# Patient Record
Sex: Female | Born: 1950
Health system: Southern US, Community
[De-identification: ages and names within clinical notes are randomized; demographics above are authoritative.]

## PROBLEM LIST (undated history)

## (undated) DIAGNOSIS — I878 Other specified disorders of veins: Secondary | ICD-10-CM

## (undated) DIAGNOSIS — I82409 Acute embolism and thrombosis of unspecified deep veins of unspecified lower extremity: Secondary | ICD-10-CM

## (undated) DIAGNOSIS — T4145XA Adverse effect of unspecified anesthetic, initial encounter: Secondary | ICD-10-CM

## (undated) DIAGNOSIS — J984 Other disorders of lung: Secondary | ICD-10-CM

## (undated) DIAGNOSIS — E785 Hyperlipidemia, unspecified: Secondary | ICD-10-CM

## (undated) DIAGNOSIS — T8859XA Other complications of anesthesia, initial encounter: Secondary | ICD-10-CM

## (undated) DIAGNOSIS — M1990 Unspecified osteoarthritis, unspecified site: Secondary | ICD-10-CM

## (undated) DIAGNOSIS — M797 Fibromyalgia: Secondary | ICD-10-CM

## (undated) HISTORY — DX: Fibromyalgia: M79.7

## (undated) HISTORY — DX: Other specified disorders of veins: I87.8

## (undated) HISTORY — DX: Hyperlipidemia, unspecified: E78.5

## (undated) HISTORY — DX: Other disorders of lung: J98.4

## (undated) HISTORY — DX: Unspecified osteoarthritis, unspecified site: M19.90

## (undated) HISTORY — DX: Acute embolism and thrombosis of unspecified deep veins of unspecified lower extremity: I82.409

---
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
## 1978-10-19 HISTORY — PX: PARTIAL HYSTERECTOMY: SHX80

## 1980-10-19 HISTORY — PX: OTHER SURGICAL HISTORY: SHX169

## 1986-10-19 HISTORY — PX: SINUS EXPLORATION: SHX5214

## 1993-10-19 HISTORY — PX: KNEE ARTHROSCOPY: SUR90

## 2001-10-19 HISTORY — PX: OTHER SURGICAL HISTORY: SHX169

## 2002-10-19 HISTORY — PX: OTHER SURGICAL HISTORY: SHX169

## 2003-10-20 HISTORY — PX: OTHER SURGICAL HISTORY: SHX169

## 2004-10-19 HISTORY — PX: OTHER SURGICAL HISTORY: SHX169

## 2005-10-19 HISTORY — PX: TOTAL KNEE ARTHROPLASTY: SHX125

## 2006-10-19 HISTORY — PX: OTHER SURGICAL HISTORY: SHX169

## 2007-05-06 HISTORY — PX: OTHER SURGICAL HISTORY: SHX169

## 2008-10-19 HISTORY — PX: TOTAL KNEE ARTHROPLASTY: SHX125

## 2010-10-19 HISTORY — PX: OTHER SURGICAL HISTORY: SHX169

## 2012-10-19 HISTORY — PX: ESOPHAGOGASTRODUODENOSCOPY: SHX1529

## 2012-10-19 HISTORY — PX: COLONOSCOPY: SHX174

## 2015-01-04 HISTORY — PX: OTHER SURGICAL HISTORY: SHX169

## 2015-01-07 HISTORY — PX: OTHER SURGICAL HISTORY: SHX169

## 2018-01-31 ENCOUNTER — Encounter: Payer: Self-pay | Admitting: Gastroenterology

## 2018-03-19 HISTORY — PX: CARDIAC CATHETERIZATION: SHX172

## 2018-04-29 ENCOUNTER — Ambulatory Visit: Payer: Self-pay | Admitting: Gastroenterology

## 2018-05-09 ENCOUNTER — Ambulatory Visit (INDEPENDENT_AMBULATORY_CARE_PROVIDER_SITE_OTHER): Payer: Medicare Other | Admitting: Gastroenterology

## 2018-05-09 ENCOUNTER — Other Ambulatory Visit: Payer: Self-pay

## 2018-05-09 ENCOUNTER — Telehealth: Payer: Self-pay

## 2018-05-09 ENCOUNTER — Encounter: Payer: Self-pay | Admitting: Gastroenterology

## 2018-05-09 DIAGNOSIS — K219 Gastro-esophageal reflux disease without esophagitis: Secondary | ICD-10-CM

## 2018-05-09 DIAGNOSIS — R1319 Other dysphagia: Secondary | ICD-10-CM | POA: Insufficient documentation

## 2018-05-09 DIAGNOSIS — R1310 Dysphagia, unspecified: Secondary | ICD-10-CM | POA: Diagnosis not present

## 2018-05-09 NOTE — Progress Notes (Signed)
Please send copy of Office note to Dr. Carmon Ginsberg"Neil.  NIC for TCS with RMR in 01/2023.

## 2018-05-09 NOTE — Assessment & Plan Note (Signed)
67 year old female presenting for further evaluation of GERD, dysphagia to solid foods.  She reports ENT evaluation with evidence of significant acid reflux in the larynx area.  Also states she had a yeast infection which was treated.  Dysphagia has not improved with PPI therapy/H2 blocker.  Reflux symptoms have somewhat improved.  She still has some nocturnal symptoms depending on position in the bed.  Heartburn with certain foods.  Offered her EGD with esophageal dilation with Dr. Jena Gaussourk in the near future.  Plan for deep sedation as she has had in the past.  I have discussed the risks, alternatives, benefits with regards to but not limited to the risk of reaction to medication, bleeding, infection, perforation and the patient is agreeable to proceed. Written consent to be obtained.  Patient reports that she has been cleared by cardiology to have endoscopy.  Continue pantoprazole 40 mg but suggest she take 30 minutes before breakfast daily.  Continue Zantac at bedtime.  Reinforced antireflux measures.

## 2018-05-09 NOTE — Telephone Encounter (Signed)
Called and informed pt of pre-op appt 06/22/18 at 12:45pm. Letter mailed. 

## 2018-05-09 NOTE — Patient Instructions (Signed)
1. Upper endoscopy as scheduled. See separate instructions.  2. Try taking your pantoprazole on an empty stomach 30 minutes before a meal. It works better this way!

## 2018-05-09 NOTE — Progress Notes (Signed)
Primary Care Physician:  Corbin Adeourk, Robert M, MD Referring Provider: Wandra Mannanhomas O'Neill, MD Primary Gastroenterologist:  Roetta SessionsMichael Rourk, MD   Chief Complaint  Patient presents with  . Dysphagia    happens with many different foods/liquids    HPI:  Traci Thompson is a 67 y.o. female here for further evaluation of dysphagia at the request of Dr. Wandra Mannanhomas O'Neill. Patient requesting to see Dr. Jena Gaussourk.   Patient previously saw Dr. Elder CyphersShiflett in 2014 [facility]   She presents with complaints of chronic GERD, and difficulty swallowing.  Symptoms been going on for a long time.  She had dysphagia back in 2014 at time of EGD with dilation.  Esophagus appeared normal.  She cannot recall whether or not empiric dilation helped.  She states she has been to ENT who diagnosed her with LPR based on her description.  Possible yeast infection as well.  Chronically she now is on Protonix and Zantac.  Prior to that she was just taking over-the-counter antacids and Zantac.  Really no improvement in her swallowing however.  Typical reflux is well controlled.  If she eats spicy food she gets belching and heartburn.  At nighttime when she lays down if she lays on her right side then she has regurgitation.  With regards to swallowing she can take 5 or 6 pills at a time without any problems.  Liquids go down easily as well.  Solid foods feel like they are getting stuck in the esophagus off and on.  It does not happen every time.  No vomiting.  Denies abdominal pain.  Bowel function is okay.  Denies any melena or rectal bleeding.  Has had recent cardiac evaluation for dyspnea on exertion.  She reports cardiac work-up is complete and she has a 6862-month follow-up at this point.  [facility]. Cardiac catheterization on April 14, 2018 showed nonobstructive coronary artery disease.  Echocardiogram on April 06, 2018 showed LVEF of 60 to 65%, normal left ventricular  wall thickness and normal left ventricular size.  Focal thickening of the aortic valve with mild aortic stenosis.   Current Outpatient Medications  Medication Sig Dispense Refill  . acetaminophen (TYLENOL) 650 MG CR tablet Take 650 mg by mouth as needed for pain.    . Ascorbic Acid (VITAMIN C) 1000 MG tablet Take 1,000 mg by mouth daily.    Marland Kitchen. CALCIUM LACTATE PO Take by mouth. 2 tablets in the AM and 1 in the PM    . Cholecalciferol (VITAMIN D3) 5000 units TABS Take by mouth. 2 -1.0ML once a day    . Flaxseed, Linseed, (FLAX SEEDS PO) Take by mouth. 2 tablespoons once a day    . furosemide (LASIX) 40 MG tablet Take 40 mg by mouth daily as needed.    . Garlic 1000 MG CAPS Take 1 capsule by mouth daily.    Marland Kitchen. GOLDEN SEAL EXTRACT PO Take 1 tablet by mouth daily.    Marland Kitchen. guaiFENesin (MUCINEX) 600 MG 12 hr tablet Take by mouth as needed.    . IODINE, KELP, PO Take 1 tablet by mouth daily.    . magnesium gluconate (MAGONATE) 500 MG tablet Take 1,000 mg by mouth 2 (two) times daily.    . meclizine (ANTIVERT) 25 MG tablet Take 25 mg by mouth as needed for dizziness.    . Misc Natural Products (HDL RX PO) Take 1 tablet by mouth 2 (two) times daily.    .Marland Kitchen  Misc Natural Products (LUTEIN 20 PO) Take 1 tablet by mouth daily.    . Multiple Vitamins-Minerals (AIRBORNE PO) Take by mouth as needed.    . Multiple Vitamins-Minerals (MULTIVITAL) tablet Take 3 tablets by mouth daily.    . Omega-3 Fatty Acids (OMEGA 3 PO) Take 1 tablet by mouth 2 (two) times daily.    . pantoprazole (PROTONIX) 40 MG tablet Take 40 mg by mouth daily.    . Probiotic Product (PROBIOTIC PO) Take 1 capsule by mouth daily.    . ranitidine (ZANTAC) 150 MG tablet Take 150 mg by mouth at bedtime.     No current facility-administered medications for this visit.     Allergies as of 05/09/2018 - Review Complete 05/09/2018  Allergen Reaction Noted  . Adhesive [tape]  05/09/2018  . Benzalkonium Itching and Rash 05/09/2018  . Cefdinir Other  (See Comments) 05/09/2018  . Cephalexin Nausea Only 05/09/2018  . Codeine Nausea Only 05/09/2018  . Latex  05/09/2018  . Methylprednisolone Nausea Only 05/09/2018  . Morphine Nausea Only 05/09/2018  . Rifampin Nausea Only 05/09/2018  . Warfarin sodium Nausea Only 05/09/2018  . Propoxyphene  05/09/2018    Past Medical History:  Diagnosis Date  . Chronic venous stasis   . DVT (deep venous thrombosis) (HCC)   . Fibromyalgia   . Hyperlipidemia   . Hypertension   . Osteoarthritis   . Restrictive lung disease    Dr. Lorenza Evangelist    Past Surgical History:  Procedure Laterality Date  . APPENDECTOMY  [address] [place]  . Black widow spider bite  2003   [place]  . Bone spur and suture abscess excision Right 2006   Right foot: [place]  . Bone spur excision  2004   Right ankle, [place]  . CARDIAC CATHETERIZATION  03/2018   [facility]: nonobstructive CAD  . COLONOSCOPY  2014   Dr. Elder Cyphers: Propofol: Left sided diverticulosis.  10-year follow-up colonoscopy recommended  . ESOPHAGOGASTRODUODENOSCOPY  2014   Dr. Elder Cyphers: Propofol.  Small hiatal hernia.  No obvious stricture or esophageal ring.  Empirically dilated with 54 bougie dilator.  Duodenal biopsy with mild focal villous blunting but no evidence of celiac. neg for H.pylori as well.  Esophageal biopsy benign.  . Foot spur Left 2008   [place]  . Great saphenous laser ablation  2005   [place]  . great saphenous laser ablation Right 2012   [facility]  . KNEE ARTHROSCOPY Right 1995   [place], [place]  . Open stasis ulcer  1982   [place], [place]  . PARTIAL HYSTERECTOMY  1980   Uterus only, [place], [place]  . SINUS EXPLORATION  1988   [place], [place]  . Skin grafts  2004   Brown recluse spider bite-lateral right ankle, open stasis ulcer-medial right ankle [facility] [place])  . TOTAL KNEE ARTHROPLASTY Right 2007   [place]  . TOTAL KNEE ARTHROPLASTY Left 2010    [place]    Family History  Problem Relation Age of Onset  . Urinary tract infection Mother 6       died from UTI/infection  . Leukemia Father 2  . Colon cancer Neg Hx     Social History   Socioeconomic History  . Marital status: Married    Spouse name: Not on file  . Number of children: Not on file  . Years of education: Not on file  . Highest education level: Not on file  Occupational History  .  Occupation: retired Scientist, research (physical sciences)  Social Needs  . Financial resource strain: Not on file  . Food insecurity:    Worry: Not on file    Inability: Not on file  . Transportation needs:    Medical: Not on file    Non-medical: Not on file  Tobacco Use  . Smoking status: Never Smoker  . Smokeless tobacco: Never Used  Substance and Sexual Activity  . Alcohol use: Never    Frequency: Never  . Drug use: Never  . Sexual activity: Not on file  Lifestyle  . Physical activity:    Days per week: Not on file    Minutes per session: Not on file  . Stress: Not on file  Relationships  . Social connections:    Talks on phone: Not on file    Gets together: Not on file    Attends religious service: Not on file    Active member of club or organization: Not on file    Attends meetings of clubs or organizations: Not on file    Relationship status: Not on file  . Intimate partner violence:    Fear of current or ex partner: Not on file    Emotionally abused: Not on file    Physically abused: Not on file    Forced sexual activity: Not on file  Other Topics Concern  . Not on file  Social History Narrative  . Not on file      ROS:  General: Negative for anorexia, weight loss, fever, chills, fatigue, weakness. Eyes: Negative for vision changes.  ENT: Negative for hoarseness, difficulty swallowing , nasal congestion. CV: Negative for chest pain, angina, palpitations, positive dyspnea on exertion, positive peripheral edema.  Respiratory: Negative for dyspnea at rest,  dyspnea on exertion, cough, sputum, wheezing.  GI: See history of present illness. GU:  Negative for dysuria, hematuria, urinary incontinence, urinary frequency, nocturnal urination.  MS: Chronic pain, DDD, osteoarthritis, fibromyalgia.  Manages with supplements. Derm: Negative for rash or itching.  Neuro: Negative for weakness, abnormal sensation, seizure, frequent headaches, memory loss, confusion.  Psych: Negative for anxiety, depression, suicidal ideation, hallucinations.  Endo: Negative for unusual weight change.  Heme: Negative for bruising or bleeding. Allergy: Negative for rash or hives.    Physical Examination:  BP 134/79   Pulse 76   Temp (!) 97.3 F (36.3 C) (Oral)   Ht 5\' 5"  (1.651 m)   Wt 250 lb 12.8 oz (113.8 kg)   BMI 41.74 kg/m    General: Well-nourished, well-developed in no acute distress.  Very pleasant.  Accompanied by spouse. Head: Normocephalic, atraumatic.   Eyes: Conjunctiva pink, no icterus. Mouth: Oropharyngeal mucosa moist and pink , no lesions erythema or exudate. Neck: Supple without thyromegaly, masses, or lymphadenopathy.  Lungs: Clear to auscultation bilaterally.  Heart: Regular rate and rhythm, no murmurs rubs or gallops.  Abdomen: Bowel sounds are normal, nontender, nondistended, no hepatosplenomegaly or masses, no abdominal bruits or    hernia , no rebound or guarding.   Rectal: Not performed. Extremities: No lower extremity edema. No clubbing or deformities.  Neuro: Alert and oriented x 4 , grossly normal neurologically.  Skin: Warm and dry, no rash or jaundice.   Psych: Alert and cooperative, normal mood and affect.  Labs: Labs from April 06, 2018 at [facility].  White blood cell count 6900, hemoglobin 13.4, hematocrit 39.5, platelets 224,000.  BUN 14, creatinine 0.59, glucose 101, total bilirubin 0.5, alkaline phosphatase 63, AST 17, ALT  16, albumin 4.1.  Imaging Studies: No results found.

## 2018-05-09 NOTE — Progress Notes (Signed)
CC'D TO PCP °

## 2018-06-06 ENCOUNTER — Telehealth: Payer: Self-pay | Admitting: *Deleted

## 2018-06-06 NOTE — Telephone Encounter (Signed)
Patient called and had to r/s her EGD w/ MAC on 06/27/18. She has rescheduled to 07/18/18 at 11:15am. New pre-op appt is scheduled for 07/11/18 at 11:00am. I have mailed new instructions and pre-op appt to her. Called patient and LMOVM.

## 2018-06-22 ENCOUNTER — Other Ambulatory Visit (HOSPITAL_COMMUNITY): Payer: Medicare Other

## 2018-07-07 NOTE — Patient Instructions (Signed)
Traci Thompson  07/07/2018     @PREFPERIOPPHARMACY @   Your procedure is scheduled on  07/18/2018   Report to [facility] at  945   A.M.  Call this number if you have problems the morning of surgery:  [phone]   Remember:  Do not eat or drink after midnight.  You may drink clear liquids until  ( follow the instructions given to you) .  Clear liquids allowed are:                    Water, Juice (non-citric and without pulp), Carbonated beverages, Clear Tea, Black Coffee only, Plain Jell-O only, Gatorade and Plain Popsicles only    Take these medicines the morning of surgery with A SIP OF WATER  Antivert, prozac.    Do not wear jewelry, make-up or nail polish.  Do not wear lotions, powders, or perfumes, or deodorant.  Do not shave 48 hours prior to surgery.  Men may shave face and neck.  Do not bring valuables to the hospital.  [facility] is not responsible for any belongings or valuables.  Contacts, dentures or bridgework may not be worn into surgery.  Leave your suitcase in the car.  After surgery it may be brought to your room.  For patients admitted to the hospital, discharge time will be determined by your treatment team.  Patients discharged the day of surgery will not be allowed to drive home.   Name and phone number of your driver:   family Special instructions:  Follow the diet and prep instructions given to you by Dr Luvenia Starch office.  Please read over the following fact sheets that you were given. Anesthesia Post-op Instructions and Care and Recovery After Surgery       Esophagogastroduodenoscopy Esophagogastroduodenoscopy (EGD) is a procedure to examine the lining of the esophagus, stomach, and first part of the small intestine (duodenum). This procedure is done to check for problems such as inflammation, bleeding, ulcers, or growths. During this procedure, a long, flexible, lighted tube with a camera attached (endoscope) is inserted down the  throat. Tell a health care provider about:  Any allergies you have.  All medicines you are taking, including vitamins, herbs, eye drops, creams, and over-the-counter medicines.  Any problems you or family members have had with anesthetic medicines.  Any blood disorders you have.  Any surgeries you have had.  Any medical conditions you have.  Whether you are pregnant or may be pregnant. What are the risks? Generally, this is a safe procedure. However, problems may occur, including:  Infection.  Bleeding.  A tear (perforation) in the esophagus, stomach, or duodenum.  Trouble breathing.  Excessive sweating.  Spasms of the larynx.  A slowed heartbeat.  Low blood pressure.  What happens before the procedure?  Follow instructions from your health care provider about eating or drinking restrictions.  Ask your health care provider about: ? Changing or stopping your regular medicines. This is especially important if you are taking diabetes medicines or blood thinners. ? Taking medicines such as aspirin and ibuprofen. These medicines can thin your blood. Do not take these medicines before your procedure if your health care provider instructs you not to.  Plan to have someone take you home after the procedure.  If you wear dentures, be ready to remove them before the procedure. What happens during the procedure?  To reduce your risk of infection, your health care team  will wash or sanitize their hands.  An IV tube will be put in a vein in your hand or arm. You will get medicines and fluids through this tube.  You will be given one or more of the following: ? A medicine to help you relax (sedative). ? A medicine to numb the area (local anesthetic). This medicine may be sprayed into your throat. It will make you feel more comfortable and keep you from gagging or coughing during the procedure. ? A medicine for pain.  A mouth guard may be placed in your mouth to protect your  teeth and to keep you from biting on the endoscope.  You will be asked to lie on your left side.  The endoscope will be lowered down your throat into your esophagus, stomach, and duodenum.  Air will be put into the endoscope. This will help your health care provider see better.  The lining of your esophagus, stomach, and duodenum will be examined.  Your health care provider may: ? Take a tissue sample so it can be looked at in a lab (biopsy). ? Remove growths. ? Remove objects (foreign bodies) that are stuck. ? Treat any bleeding with medicines or other devices that stop tissue from bleeding. ? Widen (dilate) or stretch narrowed areas of your esophagus and stomach.  The endoscope will be taken out. The procedure may vary among health care providers and hospitals. What happens after the procedure?  Your blood pressure, heart rate, breathing rate, and blood oxygen level will be monitored often until the medicines you were given have worn off.  Do not eat or drink anything until the numbing medicine has worn off and your gag reflex has returned. This information is not intended to replace advice given to you by your health care provider. Make sure you discuss any questions you have with your health care provider. Document Released: 02/05/2005 Document Revised: 03/12/2016 Document Reviewed: 08/29/2015 Elsevier Interactive Patient Education  2018 [employer]. Esophagogastroduodenoscopy, Care After Refer to this sheet in the next few weeks. These instructions provide you with information about caring for yourself after your procedure. Your health care provider may also give you more specific instructions. Your treatment has been planned according to current medical practices, but problems sometimes occur. Call your health care provider if you have any problems or questions after your procedure. What can I expect after the procedure? After the procedure, it is common to have:  A sore  throat.  Nausea.  Bloating.  Dizziness.  Fatigue.  Follow these instructions at home:  Do not eat or drink anything until the numbing medicine (local anesthetic) has worn off and your gag reflex has returned. You will know that the local anesthetic has worn off when you can swallow comfortably.  Do not drive for 24 hours if you received a medicine to help you relax (sedative).  If your health care provider took a tissue sample for testing during the procedure, make sure to get your test results. This is your responsibility. Ask your health care provider or the department performing the test when your results will be ready.  Keep all follow-up visits as told by your health care provider. This is important. Contact a health care provider if:  You cannot stop coughing.  You are not urinating.  You are urinating less than usual. Get help right away if:  You have trouble swallowing.  You cannot eat or drink.  You have throat or chest pain that gets worse.  You  are dizzy or light-headed.  You faint.  You have nausea or vomiting.  You have chills.  You have a fever.  You have severe abdominal pain.  You have black, tarry, or bloody stools. This information is not intended to replace advice given to you by your health care provider. Make sure you discuss any questions you have with your health care provider. Document Released: 09/21/2012 Document Revised: 03/12/2016 Document Reviewed: 08/29/2015 Elsevier Interactive Patient Education  2018 [employer].  Esophageal Dilatation Esophageal dilatation is a procedure to open a blocked or narrowed part of the esophagus. The esophagus is the long tube in your throat that carries food and liquid from your mouth to your stomach. The procedure is also called esophageal dilation. You may need this procedure if you have a buildup of scar tissue in your esophagus that makes it difficult, painful, or even impossible to swallow. This  can be caused by gastroesophageal reflux disease (GERD). In rare cases, people need this procedure because they have cancer of the esophagus or a problem with the way food moves through the esophagus. Sometimes you may need to have another dilatation to enlarge the opening of the esophagus gradually. Tell a health care provider about:  Any allergies you have.  All medicines you are taking, including vitamins, herbs, eye drops, creams, and over-the-counter medicines.  Any problems you or family members have had with anesthetic medicines.  Any blood disorders you have.  Any surgeries you have had.  Any medical conditions you have.  Any antibiotic medicines you are required to take before dental procedures. What are the risks? Generally, this is a safe procedure. However, problems can occur and include:  Bleeding from a tear in the lining of the esophagus.  A hole (perforation) in the esophagus.  What happens before the procedure?  Do not eat or drink anything after midnight on the night before the procedure or as directed by your health care provider.  Ask your health care provider about changing or stopping your regular medicines. This is especially important if you are taking diabetes medicines or blood thinners.  Plan to have someone take you home after the procedure. What happens during the procedure?  You will be given a medicine that makes you relaxed and sleepy (sedative).  A medicine may be sprayed or gargled to numb the back of the throat.  Your health care provider can use various instruments to do an esophageal dilatation. During the procedure, the instrument used will be placed in your mouth and passed down into your esophagus. Options include: ? Simple dilators. This instrument is carefully placed in the esophagus to stretch it. ? Guided wire bougies. In this method, a flexible tube (endoscope) is used to insert a wire into the esophagus. The dilator is passed over  this wire to enlarge the esophagus. Then the wire is removed. ? Balloon dilators. An endoscope with a small balloon at the end is passed down into the esophagus. Inflating the balloon gently stretches the esophagus and opens it up. What happens after the procedure?  Your blood pressure, heart rate, breathing rate, and blood oxygen level will be monitored often until the medicines you were given have worn off.  Your throat may feel slightly sore and will probably still feel numb. This will improve slowly over time.  You will not be allowed to eat or drink until the throat numbness has resolved.  If this is a same-day procedure, you may be allowed to go home once  you have been able to drink, urinate, and sit on the edge of the bed without nausea or dizziness.  If this is a same-day procedure, you should have a friend or family member with you for the next 24 hours after the procedure. This information is not intended to replace advice given to you by your health care provider. Make sure you discuss any questions you have with your health care provider. Document Released: 11/26/2005 Document Revised: 03/12/2016 Document Reviewed: 02/14/2014 Elsevier Interactive Patient Education  2018 [employer]  Monitored Anesthesia Care Anesthesia is a term that refers to techniques, procedures, and medicines that help a person stay safe and comfortable during a medical procedure. Monitored anesthesia care, or sedation, is one type of anesthesia. Your anesthesia specialist may recommend sedation if you will be having a procedure that does not require you to be unconscious, such as:  Cataract surgery.  A dental procedure.  A biopsy.  A colonoscopy.  During the procedure, you may receive a medicine to help you relax (sedative). There are three levels of sedation:  Mild sedation. At this level, you may feel awake and relaxed. You will be able to follow directions.  Moderate sedation. At this level,  you will be sleepy. You may not remember the procedure.  Deep sedation. At this level, you will be asleep. You will not remember the procedure.  The more medicine you are given, the deeper your level of sedation will be. Depending on how you respond to the procedure, the anesthesia specialist may change your level of sedation or the type of anesthesia to fit your needs. An anesthesia specialist will monitor you closely during the procedure. Let your health care provider know about:  Any allergies you have.  All medicines you are taking, including vitamins, herbs, eye drops, creams, and over-the-counter medicines.  Any use of steroids (by mouth or as a cream).  Any problems you or family members have had with sedatives and anesthetic medicines.  Any blood disorders you have.  Any surgeries you have had.  Any medical conditions you have, such as sleep apnea.  Whether you are pregnant or may be pregnant.  Any use of cigarettes, alcohol, or street drugs. What are the risks? Generally, this is a safe procedure. However, problems may occur, including:  Getting too much medicine (oversedation).  Nausea.  Allergic reaction to medicines.  Trouble breathing. If this happens, a breathing tube may be used to help with breathing. It will be removed when you are awake and breathing on your own.  Heart trouble.  Lung trouble.  Before the procedure Staying hydrated Follow instructions from your health care provider about hydration, which may include:  Up to 2 hours before the procedure - you may continue to drink clear liquids, such as water, clear fruit juice, black coffee, and plain tea.  Eating and drinking restrictions Follow instructions from your health care provider about eating and drinking, which may include:  8 hours before the procedure - stop eating heavy meals or foods such as meat, fried foods, or fatty foods.  6 hours before the procedure - stop eating light meals or  foods, such as toast or cereal.  6 hours before the procedure - stop drinking milk or drinks that contain milk.  2 hours before the procedure - stop drinking clear liquids.  Medicines Ask your health care provider about:  Changing or stopping your regular medicines. This is especially important if you are taking diabetes medicines or blood thinners.  Taking medicines such as aspirin and ibuprofen. These medicines can thin your blood. Do not take these medicines before your procedure if your health care provider instructs you not to.  Tests and exams  You will have a physical exam.  You may have blood tests done to show: ? How well your kidneys and liver are working. ? How well your blood can clot.  General instructions  Plan to have someone take you home from the hospital or clinic.  If you will be going home right after the procedure, plan to have someone with you for 24 hours.  What happens during the procedure?  Your blood pressure, heart rate, breathing, level of pain and overall condition will be monitored.  An IV tube will be inserted into one of your veins.  Your anesthesia specialist will give you medicines as needed to keep you comfortable during the procedure. This may mean changing the level of sedation.  The procedure will be performed. After the procedure  Your blood pressure, heart rate, breathing rate, and blood oxygen level will be monitored until the medicines you were given have worn off.  Do not drive for 24 hours if you received a sedative.  You may: ? Feel sleepy, clumsy, or nauseous. ? Feel forgetful about what happened after the procedure. ? Have a sore throat if you had a breathing tube during the procedure. ? Vomit. This information is not intended to replace advice given to you by your health care provider. Make sure you discuss any questions you have with your health care provider. Document Released: 07/01/2005 Document Revised: 03/13/2016  Document Reviewed: 01/26/2016 Elsevier Interactive Patient Education  2018 [employer] Monitored Anesthesia Care, Care After These instructions provide you with information about caring for yourself after your procedure. Your health care provider may also give you more specific instructions. Your treatment has been planned according to current medical practices, but problems sometimes occur. Call your health care provider if you have any problems or questions after your procedure. What can I expect after the procedure? After your procedure, it is common to:  Feel sleepy for several hours.  Feel clumsy and have poor balance for several hours.  Feel forgetful about what happened after the procedure.  Have poor judgment for several hours.  Feel nauseous or vomit.  Have a sore throat if you had a breathing tube during the procedure.  Follow these instructions at home: For at least 24 hours after the procedure:   Do not: ? Participate in activities in which you could fall or become injured. ? Drive. ? Use heavy machinery. ? Drink alcohol. ? Take sleeping pills or medicines that cause drowsiness. ? Make important decisions or sign legal documents. ? Take care of children on your own.  Rest. Eating and drinking  Follow the diet that is recommended by your health care provider.  If you vomit, drink water, juice, or soup when you can drink without vomiting.  Make sure you have little or no nausea before eating solid foods. General instructions  Have a responsible adult stay with you until you are awake and alert.  Take over-the-counter and prescription medicines only as told by your health care provider.  If you smoke, do not smoke without supervision.  Keep all follow-up visits as told by your health care provider. This is important. Contact a health care provider if:  You keep feeling nauseous or you keep vomiting.  You feel light-headed.  You develop a rash.  You  have a fever. Get help right away if:  You have trouble breathing. This information is not intended to replace advice given to you by your health care provider. Make sure you discuss any questions you have with your health care provider. Document Released: 01/26/2016 Document Revised: 05/27/2016 Document Reviewed: 01/26/2016 Elsevier Interactive Patient Education  [employer].

## 2018-07-11 ENCOUNTER — Encounter (HOSPITAL_COMMUNITY)
Admission: RE | Admit: 2018-07-11 | Discharge: 2018-07-11 | Disposition: A | Discharge status: Home / Self Care | Payer: Medicare Other | Source: Ambulatory Visit | Attending: Internal Medicine | Admitting: Internal Medicine

## 2018-07-11 ENCOUNTER — Other Ambulatory Visit: Payer: Self-pay

## 2018-07-11 ENCOUNTER — Encounter (HOSPITAL_COMMUNITY): Payer: Self-pay

## 2018-07-11 DIAGNOSIS — Z01812 Encounter for preprocedural laboratory examination: Secondary | ICD-10-CM | POA: Diagnosis present

## 2018-07-11 DIAGNOSIS — R9431 Abnormal electrocardiogram [ECG] [EKG]: Secondary | ICD-10-CM | POA: Insufficient documentation

## 2018-07-11 DIAGNOSIS — Z01810 Encounter for preprocedural cardiovascular examination: Secondary | ICD-10-CM | POA: Insufficient documentation

## 2018-07-11 DIAGNOSIS — I4510 Unspecified right bundle-branch block: Secondary | ICD-10-CM | POA: Diagnosis not present

## 2018-07-11 HISTORY — DX: Adverse effect of unspecified anesthetic, initial encounter: T41.45XA

## 2018-07-11 HISTORY — DX: Other complications of anesthesia, initial encounter: T88.59XA

## 2018-07-11 LAB — CBC WITH DIFFERENTIAL/PLATELET
Basophils Absolute: 0 10*3/uL (ref 0.0–0.1)
Basophils Relative: 0 %
EOS PCT: 2 %
Eosinophils Absolute: 0.1 10*3/uL (ref 0.0–0.7)
HEMATOCRIT: 40.7 % (ref 36.0–46.0)
Hemoglobin: 13.5 g/dL (ref 12.0–15.0)
LYMPHS PCT: 37 %
Lymphs Abs: 2.5 10*3/uL (ref 0.7–4.0)
MCH: 29 pg (ref 26.0–34.0)
MCHC: 33.2 g/dL (ref 30.0–36.0)
MCV: 87.5 fL (ref 78.0–100.0)
MONO ABS: 0.4 10*3/uL (ref 0.1–1.0)
Monocytes Relative: 6 %
NEUTROS ABS: 3.6 10*3/uL (ref 1.7–7.7)
Neutrophils Relative %: 55 %
PLATELETS: 245 10*3/uL (ref 150–400)
RBC: 4.65 MIL/uL (ref 3.87–5.11)
RDW: 13.6 % (ref 11.5–15.5)
WBC: 6.7 10*3/uL (ref 4.0–10.5)

## 2018-07-11 LAB — BASIC METABOLIC PANEL
ANION GAP: 7 (ref 5–15)
BUN: 16 mg/dL (ref 8–23)
CALCIUM: 9.3 mg/dL (ref 8.9–10.3)
CO2: 28 mmol/L (ref 22–32)
Chloride: 103 mmol/L (ref 98–111)
Creatinine, Ser: 0.58 mg/dL (ref 0.44–1.00)
GLUCOSE: 99 mg/dL (ref 70–99)
Potassium: 4.1 mmol/L (ref 3.5–5.1)
Sodium: 138 mmol/L (ref 135–145)

## 2018-07-18 ENCOUNTER — Ambulatory Visit (HOSPITAL_COMMUNITY)
Admission: RE | Admit: 2018-07-18 | Discharge: 2018-07-18 | Disposition: A | Discharge status: Home / Self Care | Payer: Medicare Other | Source: Ambulatory Visit | Attending: Internal Medicine | Admitting: Internal Medicine

## 2018-07-18 ENCOUNTER — Encounter (HOSPITAL_COMMUNITY): Payer: Self-pay | Admitting: *Deleted

## 2018-07-18 ENCOUNTER — Encounter (HOSPITAL_COMMUNITY)
Admission: RE | Disposition: A | Discharge status: Home / Self Care | Payer: Self-pay | Source: Ambulatory Visit | Attending: Internal Medicine

## 2018-07-18 ENCOUNTER — Ambulatory Visit (HOSPITAL_COMMUNITY): Payer: Medicare Other | Admitting: Anesthesiology

## 2018-07-18 ENCOUNTER — Other Ambulatory Visit: Payer: Self-pay

## 2018-07-18 DIAGNOSIS — Z96653 Presence of artificial knee joint, bilateral: Secondary | ICD-10-CM | POA: Insufficient documentation

## 2018-07-18 DIAGNOSIS — K209 Esophagitis, unspecified: Secondary | ICD-10-CM

## 2018-07-18 DIAGNOSIS — Z86718 Personal history of other venous thrombosis and embolism: Secondary | ICD-10-CM | POA: Diagnosis not present

## 2018-07-18 DIAGNOSIS — Z79899 Other long term (current) drug therapy: Secondary | ICD-10-CM | POA: Insufficient documentation

## 2018-07-18 DIAGNOSIS — K449 Diaphragmatic hernia without obstruction or gangrene: Secondary | ICD-10-CM | POA: Diagnosis not present

## 2018-07-18 DIAGNOSIS — R1310 Dysphagia, unspecified: Secondary | ICD-10-CM | POA: Diagnosis not present

## 2018-07-18 DIAGNOSIS — K210 Gastro-esophageal reflux disease with esophagitis: Secondary | ICD-10-CM | POA: Insufficient documentation

## 2018-07-18 DIAGNOSIS — R1319 Other dysphagia: Secondary | ICD-10-CM

## 2018-07-18 DIAGNOSIS — K219 Gastro-esophageal reflux disease without esophagitis: Secondary | ICD-10-CM

## 2018-07-18 HISTORY — PX: ESOPHAGOGASTRODUODENOSCOPY (EGD) WITH PROPOFOL: SHX5813

## 2018-07-18 HISTORY — PX: MALONEY DILATION: SHX5535

## 2018-07-18 SURGERY — ESOPHAGOGASTRODUODENOSCOPY (EGD) WITH PROPOFOL
Anesthesia: Monitor Anesthesia Care

## 2018-07-18 MED ORDER — HYDROMORPHONE HCL 1 MG/ML IJ SOLN: 0.2500 mg | INTRAMUSCULAR | Status: DC | PRN

## 2018-07-18 MED ORDER — HYDROCODONE-ACETAMINOPHEN 7.5-325 MG PO TABS: 1.0000 | ORAL_TABLET | Freq: Once | ORAL | Status: DC | PRN

## 2018-07-18 MED ORDER — STERILE WATER FOR IRRIGATION IR SOLN
Status: DC | PRN
  Administered 2018-07-18: 100 mL

## 2018-07-18 MED ORDER — PROPOFOL 500 MG/50ML IV EMUL
INTRAVENOUS | Status: DC | PRN
  Administered 2018-07-18: 150 ug/kg/min via INTRAVENOUS

## 2018-07-18 MED ORDER — LACTATED RINGERS IV SOLN
INTRAVENOUS | Status: DC | PRN
  Administered 2018-07-18: 10:00:00 via INTRAVENOUS

## 2018-07-18 MED ORDER — CHLORHEXIDINE GLUCONATE CLOTH 2 % EX PADS: 6.0000 | MEDICATED_PAD | Freq: Once | CUTANEOUS | Status: DC

## 2018-07-18 MED ORDER — LACTATED RINGERS IV SOLN: INTRAVENOUS | Status: DC

## 2018-07-18 MED ORDER — PROPOFOL 10 MG/ML IV BOLUS
INTRAVENOUS | Status: DC | PRN
  Administered 2018-07-18: 20 mg via INTRAVENOUS

## 2018-07-18 MED ORDER — MEPERIDINE HCL 100 MG/ML IJ SOLN: 6.2500 mg | INTRAMUSCULAR | Status: DC | PRN

## 2018-07-18 MED ORDER — PROMETHAZINE HCL 25 MG/ML IJ SOLN: 6.2500 mg | INTRAMUSCULAR | Status: DC | PRN

## 2018-07-18 NOTE — Transfer of Care (Signed)
Immediate Anesthesia Transfer of Care Note  Patient: Traci Thompson  Procedure(s) Performed: ESOPHAGOGASTRODUODENOSCOPY (EGD) WITH PROPOFOL (N/A ) MALONEY DILATION (N/A )  Patient Location: PACU  Anesthesia Type:MAC  Level of Consciousness: awake  Airway & Oxygen Therapy: Patient Spontanous Breathing  Post-op Assessment: Report given to RN  Post vital signs: Reviewed and stable  Last Vitals:  Vitals Value Taken Time  BP 114/52 07/18/2018 11:05 AM  Temp    Pulse 75 07/18/2018 11:07 AM  Resp 16 07/18/2018 11:07 AM  SpO2 97 % 07/18/2018 11:07 AM  Vitals shown include unvalidated device data.  Last Pain:  Vitals:   07/18/18 1101  TempSrc:   PainSc: 0-No pain         Complications: No apparent anesthesia complications

## 2018-07-18 NOTE — Anesthesia Preprocedure Evaluation (Signed)
Anesthesia Evaluation  Patient identified by MRN, date of birth, ID band Patient awake    Reviewed: Allergy & Precautions, H&P , NPO status , Patient's Chart, lab work & pertinent test results, reviewed documented beta blocker date and time   History of Anesthesia Complications (+) PONV, AWARENESS UNDER ANESTHESIA and history of anesthetic complications  Airway Mallampati: II  TM Distance: >3 FB Neck ROM: full    Dental no notable dental hx. (+) Teeth Intact, Dental Advidsory Given   Pulmonary neg pulmonary ROS,    Pulmonary exam normal breath sounds clear to auscultation       Cardiovascular Exercise Tolerance: Good negative cardio ROS   Rhythm:regular Rate:Normal     Neuro/Psych  Neuromuscular disease negative neurological ROS  negative psych ROS   GI/Hepatic negative GI ROS, Neg liver ROS, GERD  ,  Endo/Other  negative endocrine ROS  Renal/GU negative Renal ROS  negative genitourinary   Musculoskeletal   Abdominal   Peds  Hematology negative hematology ROS (+)   Anesthesia Other Findings NSR at 69, incomplete RBBB DVT  Restrictive lung ds per record- pt denies dx "Awareness under anesthesia" SAB with sedation, TKA  Reproductive/Obstetrics negative OB ROS                             Anesthesia Physical Anesthesia Plan  ASA: II  Anesthesia Plan: MAC   Post-op Pain Management:    Induction:   PONV Risk Score and Plan:   Airway Management Planned:   Additional Equipment:   Intra-op Plan:   Post-operative Plan:   Informed Consent: I have reviewed the patients History and Physical, chart, labs and discussed the procedure including the risks, benefits and alternatives for the proposed anesthesia with the patient or authorized representative who has indicated his/her understanding and acceptance.   Dental Advisory Given  Plan Discussed with: CRNA and  Anesthesiologist  Anesthesia Plan Comments:         Anesthesia Quick Evaluation

## 2018-07-18 NOTE — Discharge Instructions (Signed)
EGD Discharge instructions Please read the instructions outlined below and refer to this sheet in the next few weeks. These discharge instructions provide you with general information on caring for yourself after you leave the hospital. Your doctor may also give you specific instructions. While your treatment has been planned according to the most current medical practices available, unavoidable complications occasionally occur. If you have any problems or questions after discharge, please call your doctor. ACTIVITY  You may resume your regular activity but move at a slower pace for the next 24 hours.   Take frequent rest periods for the next 24 hours.   Walking will help expel (get rid of) the air and reduce the bloated feeling in your abdomen.   No driving for 24 hours (because of the anesthesia (medicine) used during the test).   You may shower.   Do not sign any important legal documents or operate any machinery for 24 hours (because of the anesthesia used during the test).  NUTRITION  Drink plenty of fluids.   You may resume your normal diet.   Begin with a light meal and progress to your normal diet.   Avoid alcoholic beverages for 24 hours or as instructed by your caregiver.  MEDICATIONS  You may resume your normal medications unless your caregiver tells you otherwise.  WHAT YOU CAN EXPECT TODAY  You may experience abdominal discomfort such as a feeling of fullness or gas pains.  FOLLOW-UP  Your doctor will discuss the results of your test with you.  SEEK IMMEDIATE MEDICAL ATTENTION IF ANY OF THE FOLLOWING OCCUR:  Excessive nausea (feeling sick to your stomach) and/or vomiting.   Severe abdominal pain and distention (swelling).   Trouble swallowing.   Temperature over 101 F (37.8 C).   Rectal bleeding or vomiting of blood.    GERD information provided  Hiatal hernia information provided  For now, increase Protonix to 40 mg twice daily  Office visit with  [place] in 3 months     Gastroesophageal Reflux Disease, Adult Normally, food travels down the esophagus and stays in the stomach to be digested. If a person has gastroesophageal reflux disease (GERD), food and stomach acid move back up into the esophagus. When this happens, the esophagus becomes sore and swollen (inflamed). Over time, GERD can make small holes (ulcers) in the lining of the esophagus. Follow these instructions at home: Diet  Follow a diet as told by your doctor. You may need to avoid foods and drinks such as: ? Coffee and tea (with or without caffeine). ? Drinks that contain alcohol. ? Energy drinks and sports drinks. ? Carbonated drinks or sodas. ? Chocolate and cocoa. ? Peppermint and mint flavorings. ? Garlic and onions. ? Horseradish. ? Spicy and acidic foods, such as peppers, chili powder, curry powder, vinegar, hot sauces, and BBQ sauce. ? Citrus fruit juices and citrus fruits, such as oranges, lemons, and limes. ? Tomato-based foods, such as red sauce, chili, salsa, and pizza with red sauce. ? Fried and fatty foods, such as donuts, french fries, potato chips, and high-fat dressings. ? High-fat meats, such as hot dogs, rib eye steak, sausage, ham, and bacon. ? High-fat dairy items, such as whole milk, butter, and cream cheese.  Eat small meals often. Avoid eating large meals.  Avoid drinking large amounts of liquid with your meals.  Avoid eating meals during the 2-3 hours before bedtime.  Avoid lying down right after you eat.  Do not exercise right after you eat. General  instructions  Pay attention to any changes in your symptoms.  Take over-the-counter and prescription medicines only as told by your doctor. Do not take aspirin, ibuprofen, or other NSAIDs unless your doctor says it is okay.  Do not use any tobacco products, including cigarettes, chewing tobacco, and e-cigarettes. If you need help quitting, ask your doctor.  Wear loose clothes. Do not wear  anything tight around your waist.  Raise (elevate) the head of your bed about 6 inches (15 cm).  Try to lower your stress. If you need help doing this, ask your doctor.  If you are overweight, lose an amount of weight that is healthy for you. Ask your doctor about a safe weight loss goal.  Keep all follow-up visits as told by your doctor. This is important. Contact a doctor if:  You have new symptoms.  You lose weight and you do not know why it is happening.  You have trouble swallowing, or it hurts to swallow.  You have wheezing or a cough that keeps happening.  Your symptoms do not get better with treatment.  You have a hoarse voice. Get help right away if:  You have pain in your arms, neck, jaw, teeth, or back.  You feel sweaty, dizzy, or light-headed.  You have chest pain or shortness of breath.  You throw up (vomit) and your throw up looks like blood or coffee grounds.  You pass out (faint).  Your poop (stool) is bloody or black.  You cannot swallow, drink, or eat. This information is not intended to replace advice given to you by your health care provider. Make sure you discuss any questions you have with your health care provider. Document Released: 03/23/2008 Document Revised: 03/12/2016 Document Reviewed: 01/30/2015 Elsevier Interactive Patient Education  2018 [employer]     Hiatal Hernia A hiatal hernia occurs when part of the stomach slides above the muscle that separates the abdomen from the chest (diaphragm). A person can be born with a hiatal hernia (congenital), or it may develop over time. In almost all cases of hiatal hernia, only the top part of the stomach pushes through the diaphragm. Many people have a hiatal hernia with no symptoms. The larger the hernia, the more likely it is that you will have symptoms. In some cases, a hiatal hernia allows stomach acid to flow back into the tube that carries food from your mouth to your stomach (esophagus).  This may cause heartburn symptoms. Severe heartburn symptoms may mean that you have developed a condition called gastroesophageal reflux disease (GERD). What are the causes? This condition is caused by a weakness in the opening (hiatus) where the esophagus passes through the diaphragm to attach to the upper part of the stomach. A person may be born with a weakness in the hiatus, or a weakness can develop over time. What increases the risk? This condition is more likely to develop in:  Older people. Age is a major risk factor for a hiatal hernia, especially if you are over the age of 27.  Pregnant women.  People who are overweight.  People who have frequent constipation.  What are the signs or symptoms? Symptoms of this condition usually develop in the form of GERD symptoms. Symptoms include:  Heartburn.  Belching.  Indigestion.  Trouble swallowing.  Coughing or wheezing.  Sore throat.  Hoarseness.  Chest pain.  Nausea and vomiting.  How is this diagnosed? This condition may be diagnosed during testing for GERD. Tests that may be done  include:  X-rays of your stomach or chest.  An upper gastrointestinal (GI) series. This is an X-ray exam of your GI tract that is taken after you swallow a chalky liquid that shows up clearly on the X-ray.  Endoscopy. This is a procedure to look into your stomach using a thin, flexible tube that has a tiny camera and light on the end of it.  How is this treated? This condition may be treated by:  Dietary and lifestyle changes to help reduce GERD symptoms.  Medicines. These may include: ? Over-the-counter antacids. ? Medicines that make your stomach empty more quickly. ? Medicines that block the production of stomach acid (H2 blockers). ? Stronger medicines to reduce stomach acid (proton pump inhibitors).  Surgery to repair the hernia, if other treatments are not helping.  If you have no symptoms, you may not need  treatment. Follow these instructions at home: Lifestyle and activity  Do not use any products that contain nicotine or tobacco, such as cigarettes and e-cigarettes. If you need help quitting, ask your health care provider.  Try to achieve and maintain a healthy body weight.  Avoid putting pressure on your abdomen. Anything that puts pressure on your abdomen increases the amount of acid that may be pushed up into your esophagus. ? Avoid bending over, especially after eating. ? Raise the head of your bed by putting blocks under the legs. This keeps your head and esophagus higher than your stomach. ? Do not wear tight clothing around your chest or stomach. ? Try not to strain when having a bowel movement, when urinating, or when lifting heavy objects. Eating and drinking  Avoid foods that can worsen GERD symptoms. These may include: ? Fatty foods, like fried foods. ? Citrus fruits, like oranges or lemon. ? Other foods and drinks that contain acid, like orange juice or tomatoes. ? Spicy food. ? Chocolate.  Eat frequent small meals instead of three large meals a day. This helps prevent your stomach from getting too full. ? Eat slowly. ? Do not lie down right after eating. ? Do not eat 1-2 hours before bed.  Do not drink beverages with caffeine. These include cola, coffee, cocoa, and tea.  Do not drink alcohol. General instructions  Take over-the-counter and prescription medicines only as told by your health care provider.  Keep all follow-up visits as told by your health care provider. This is important. Contact a health care provider if:  Your symptoms are not controlled with medicines or lifestyle changes.  You are having trouble swallowing.  You have coughing or wheezing that will not go away. Get help right away if:  Your pain is getting worse.  Your pain spreads to your arms, neck, jaw, teeth, or back.  You have shortness of breath.  You sweat for no reason.  You  feel sick to your stomach (nauseous) or you vomit.  You vomit blood.  You have bright red blood in your stools.  You have black, tarry stools. This information is not intended to replace advice given to you by your health care provider. Make sure you discuss any questions you have with your health care provider. Document Released: 12/26/2003 Document Revised: 09/28/2016 Document Reviewed: 09/28/2016 Elsevier Interactive Patient Education  2018 [employer]     Monitored Anesthesia Care, Care After These instructions provide you with information about caring for yourself after your procedure. Your health care provider may also give you more specific instructions. Your treatment has been planned according to  current medical practices, but problems sometimes occur. Call your health care provider if you have any problems or questions after your procedure. What can I expect after the procedure? After your procedure, it is common to:  Feel sleepy for several hours.  Feel clumsy and have poor balance for several hours.  Feel forgetful about what happened after the procedure.  Have poor judgment for several hours.  Feel nauseous or vomit.  Have a sore throat if you had a breathing tube during the procedure.  Follow these instructions at home: For at least 24 hours after the procedure:   Do not: ? Participate in activities in which you could fall or become injured. ? Drive. ? Use heavy machinery. ? Drink alcohol. ? Take sleeping pills or medicines that cause drowsiness. ? Make important decisions or sign legal documents. ? Take care of children on your own.  Rest. Eating and drinking  Follow the diet that is recommended by your health care provider.  If you vomit, drink water, juice, or soup when you can drink without vomiting.  Make sure you have little or no nausea before eating solid foods. General instructions  Have a responsible adult stay with you until you are  awake and alert.  Take over-the-counter and prescription medicines only as told by your health care provider.  If you smoke, do not smoke without supervision.  Keep all follow-up visits as told by your health care provider. This is important. Contact a health care provider if:  You keep feeling nauseous or you keep vomiting.  You feel light-headed.  You develop a rash.  You have a fever. Get help right away if:  You have trouble breathing. This information is not intended to replace advice given to you by your health care provider. Make sure you discuss any questions you have with your health care provider. Document Released: 01/26/2016 Document Revised: 05/27/2016 Document Reviewed: 01/26/2016 Elsevier Interactive Patient Education  [employer].

## 2018-07-18 NOTE — Op Note (Signed)
[facility] Patient Name: Traci Thompson Procedure Date: 07/18/2018 10:26 AM MRN: [id number] Date of Birth: August 04, 1951 Attending MD: Gennette Pac , MD CSN: [id number] Age: 67 Admit Type: Outpatient Procedure:                Upper GI endoscopy Indications:              Dysphagia Providers:                Gennette Pac, MD, Edrick Kins, RN, Dyann Ruddle Referring MD:             Carlena Bjornstad Hungarland, Ihor Austin. Bufford Buttner, MD Medicines:                Propofol per Anesthesia Complications:            No immediate complications. Estimated Blood Loss:     Estimated blood loss: none. Procedure:                Pre-Anesthesia Assessment:                           - Prior to the procedure, a History and Physical                            was performed, and patient medications and                            allergies were reviewed. The patient's tolerance of                            previous anesthesia was also reviewed. The risks                            and benefits of the procedure and the sedation                            options and risks were discussed with the patient.                            All questions were answered, and informed consent                            was obtained. Prior Anticoagulants: The patient has                            taken no previous anticoagulant or antiplatelet                            agents. ASA Grade Assessment: II - A patient with                            mild systemic disease. After reviewing the risks  and benefits, the patient was deemed in                            satisfactory condition to undergo the procedure.                           After obtaining informed consent, the endoscope was                            passed under direct vision. Throughout the                            procedure, the patient's blood pressure, pulse, and   oxygen saturations were monitored continuously. The                            GIF-H190 ([id number]) scope was introduced through the                            mouth, and advanced to the second part of duodenum.                            The upper GI endoscopy was accomplished without                            difficulty. The patient tolerated the procedure                            well. Scope In: 10:52:06 AM Scope Out: 10:58:56 AM Total Procedure Duration: 0 hours 6 minutes 50 seconds  Findings:      Esophagitis was found. Erosions within 5 mm of the GE junction. Patent       esophagus throughout its course. No tumor. No Barrett's epithelium seen.       The scope was withdrawn. Dilation was performed with a Maloney dilator       with no resistance at 54 Fr. The scope was withdrawn. Dilation was       performed with a Maloney dilator with mild resistance at 56 Fr. The       dilation site was examined following endoscope reinsertion and showed no       change. Estimated blood loss: none.      A small hiatal hernia was present.      The stomach was otherwise without abnormality.      The duodenal bulb and second portion of the duodenum were normal. Impression:               -Mild erosive reflux esophagitis. Status post                            esophageal dilation.                           Small hiatal hernia.                           - The stomach was otherwise normal.                           -  Normal duodenal bulb and second portion of the                            duodenum. Moderate Sedation:      Moderate (conscious) sedation was personally administered by an       anesthesia professional. The following parameters were monitored: oxygen       saturation, heart rate, blood pressure, respiratory rate, EKG, adequacy       of pulmonary ventilation, and response to care. Total physician       intraservice time was 10 minutes. Recommendation:           - Patient has a contact  number available for                            emergencies. The signs and symptoms of potential                            delayed complications were discussed with the                            patient. Return to normal activities tomorrow.                            Written discharge instructions were provided to the                            patient.                           - Advance diet as tolerated. For now, increase                            Protonix to 40 mg twice daily. Office visit with [place]                            in 3 months. Procedure Code(s):        --- Professional ---                           [phone], Esophagogastroduodenoscopy, flexible,                            transoral; with biopsy, single or multiple                           43450, Dilation of esophagus, by unguided sound or                            bougie, single or multiple passes Diagnosis Code(s):        --- Professional ---                           K20.9, Esophagitis, unspecified                           K44.9, Diaphragmatic hernia without  obstruction or                            gangrene                           R13.10, Dysphagia, unspecified CPT copyright 2017 American [employer]. All rights reserved. The codes documented in this report are preliminary and upon coder review may  be revised to meet current compliance requirements. Gerrit Friends. Yina Riviere, MD Gennette Pac, MD 07/18/2018 11:10:39 AM This report has been signed electronically. Number of Addenda: 0

## 2018-07-18 NOTE — Anesthesia Postprocedure Evaluation (Signed)
Anesthesia Post Note  Patient: Traci Thompson  Procedure(s) Performed: ESOPHAGOGASTRODUODENOSCOPY (EGD) WITH PROPOFOL (N/A ) MALONEY DILATION (N/A )  Patient location during evaluation: PACU Anesthesia Type: MAC Level of consciousness: awake and alert and oriented Pain management: pain level controlled Vital Signs Assessment: post-procedure vital signs reviewed and stable Respiratory status: spontaneous breathing Cardiovascular status: blood pressure returned to baseline and stable Postop Assessment: no apparent nausea or vomiting Anesthetic complications: no     Last Vitals:  Vitals:   07/18/18 0957  Pulse: 70  Resp: 16  Temp: 36.6 C  SpO2: 99%    Last Pain:  Vitals:   07/18/18 1101  TempSrc:   PainSc: 0-No pain                 Shane Melby

## 2018-07-18 NOTE — H&P (Signed)
@LOGO @   Primary Care Physician:  Maximiano Coss, MD Primary Gastroenterologist:  Dr. Jena Gauss  Pre-Procedure History & Physical: HPI:  Traci Thompson is a 67 y.o. female here for further evaluation of dysphagia via EGD.  History of EGD with esophageal dilation in [place] previously.  Past Medical History:  Diagnosis Date  . Chronic venous stasis   . Complication of anesthesia    has been difficult to put to sleep in the past  . DVT (deep venous thrombosis) (HCC)   . Fibromyalgia   . Hyperlipidemia   . Osteoarthritis   . Restrictive lung disease    Dr. Lorenza Evangelist    Past Surgical History:  Procedure Laterality Date  . APPENDECTOMY  [address] [place]  . Black widow spider bite  2003   [place]  . Bone spur and suture abscess excision Right 2006   Right foot: [place]  . Bone spur excision  2004   Right ankle, [place]  . CARDIAC CATHETERIZATION  03/2018   [facility]: nonobstructive CAD  . COLONOSCOPY  2014   Dr. Elder Cyphers: Propofol: Left sided diverticulosis.  10-year follow-up colonoscopy recommended  . ESOPHAGOGASTRODUODENOSCOPY  2014   Dr. Elder Cyphers: Propofol.  Small hiatal hernia.  No obvious stricture or esophageal ring.  Empirically dilated with 54 bougie dilator.  Duodenal biopsy with mild focal villous blunting but no evidence of celiac. neg for H.pylori as well.  Esophageal biopsy benign.  . Foot spur Left 2008   [place]  . Great saphenous laser ablation  2005   [place]  . great saphenous laser ablation Right 2012   [facility]  . KNEE ARTHROSCOPY Right 1995   [place], [place]  . Open stasis ulcer  1982   [place], [place]  . PARTIAL HYSTERECTOMY  1980   Uterus only, [employer], [place]  . SINUS EXPLORATION  1988   [place], [place]  . Skin grafts  2004   Brown recluse spider bite-lateral right ankle, open stasis ulcer-medial right ankle [facility] [place]  . TOTAL KNEE ARTHROPLASTY Right 2007   [place]   . TOTAL KNEE ARTHROPLASTY Left 2010   [place]    Prior to Admission medications   Medication Sig Start Date End Date Taking? Authorizing Provider  acetaminophen (TYLENOL) 650 MG CR tablet Take 650 mg by mouth as needed for pain.   Yes [provider]  Ascorbic Acid (VITAMIN C) 1000 MG tablet Take 1,000 mg by mouth daily.   Yes [provider]  CALCIUM LACTATE PO Take 1-2 tablets by mouth See admin instructions. 2 tablets in the AM and 1 in the PM    Yes [provider]  Cholecalciferol (VITAMIN D3) 5000 units TABS Take 10,000 Units by mouth daily. 2 -1. once a day    Yes [provider]  Flaxseed, Linseed, (FLAX SEEDS PO) Take by mouth. 2 tablespoons once a day   Yes [provider]  furosemide (LASIX) 40 MG tablet Take 40 mg by mouth daily as needed for fluid.    Yes [provider]  Garlic 1000 MG CAPS Take 1 capsule by mouth daily.   Yes [provider]  GOLDEN SEAL EXTRACT PO Take 1 tablet by mouth daily.   Yes [provider]  guaiFENesin (MUCINEX) 600 MG 12 hr tablet Take 600 mg by mouth 2 (two) times daily as needed for cough.    Yes [provider]  IODINE, KELP, PO Take 1 tablet by  mouth daily.   Yes [provider]  magnesium gluconate (MAGONATE) 500 MG tablet Take 1,000 mg by mouth 2 (two) times daily.   Yes [provider]  meclizine (ANTIVERT) 25 MG tablet Take 25 mg by mouth 2 (two) times daily as needed for dizziness.    Yes [provider]  Misc Natural Products (HDL RX PO) Take 1 tablet by mouth 2 (two) times daily.   Yes [provider]  Misc Natural Products (LUTEIN 20 PO) Take 1 tablet by mouth daily.   Yes [provider]  Multiple Vitamins-Minerals (AIRBORNE PO) Take by mouth as needed.   Yes [provider]  Multiple Vitamins-Minerals (MULTIVITAL) tablet Take 3 tablets by mouth daily.   Yes [provider]  Omega-3  Fatty Acids (OMEGA 3 PO) Take 1 tablet by mouth 2 (two) times daily.   Yes [provider]  pantoprazole (PROTONIX) 40 MG tablet Take 40 mg by mouth daily.   Yes [provider]  Probiotic Product (PROBIOTIC PO) Take 1 capsule by mouth daily.   Yes [provider]    Allergies as of 05/09/2018 - Review Complete 05/09/2018  Allergen Reaction Noted  . Adhesive [tape]  05/09/2018  . Benzalkonium Itching and Rash 05/09/2018  . Cefdinir Other (See Comments) 05/09/2018  . Cephalexin Nausea Only 05/09/2018  . Codeine Nausea Only 05/09/2018  . Latex  05/09/2018  . Methylprednisolone Nausea Only 05/09/2018  . Morphine Nausea Only 05/09/2018  . Rifampin Nausea Only 05/09/2018  . Warfarin sodium Nausea Only 05/09/2018  . Propoxyphene  05/09/2018    Family History  Problem Relation Age of Onset  . Urinary tract infection Mother 18       died from UTI/infection  . Leukemia Father 62  . Colon cancer Neg Hx     Social History   Socioeconomic History  . Marital status: Married    Spouse name: Not on file  . Number of children: Not on file  . Years of education: Not on file  . Highest education level: Not on file  Occupational History  . Occupation: retired Scientist, research (physical sciences)  Social Needs  . Financial resource strain: Not on file  . Food insecurity:    Worry: Not on file    Inability: Not on file  . Transportation needs:    Medical: Not on file    Non-medical: Not on file  Tobacco Use  . Smoking status: Never Smoker  . Smokeless tobacco: Never Used  Substance and Sexual Activity  . Alcohol use: Never    Frequency: Never  . Drug use: Never  . Sexual activity: Not on file  Lifestyle  . Physical activity:    Days per week: Not on file    Minutes per session: Not on file  . Stress: Not on file  Relationships  . Social connections:    Talks on phone: Not on file    Gets together: Not on file    Attends religious service: Not on file    Active  member of club or organization: Not on file    Attends meetings of clubs or organizations: Not on file    Relationship status: Not on file  . Intimate partner violence:    Fear of current or ex partner: Not on file    Emotionally abused: Not on file    Physically abused: Not on file    Forced sexual activity: Not on file  Other Topics Concern  . Not on file  Social History Narrative  . Not on file    Review of Systems: See HPI, otherwise negative ROS  Physical Exam: Pulse 70   Temp 97.9 F (36.6 C) (Oral)   Resp 16   SpO2 99%  General:   Alert,  Well-developed, well-nourished, pleasant and cooperative in NAD SNeck:  Supple; no masses or thyromegaly. No significant cervical adenopathy. Lungs:  Clear throughout to auscultation.   No wheezes, crackles, or rhonchi. No acute distress. Heart:  Regular rate and rhythm; no murmurs, clicks, rubs,  or gallops. Abdomen: Non-distended, normal bowel sounds.  Soft and nontender without appreciable mass or hepatosplenomegaly.  Pulses:  Normal pulses noted. Extremities:  Without clubbing or edema.  Impression/Plan: Pleasant 67 year old lady with history of recurrent esophageal dysphagia which is responded to esophageal dilation in the past.  She is here for repeat EGD with esophageal dilation as feasible/appropriate.  The risks, benefits, limitations, alternatives and imponderables have been reviewed with the patient. Potential for esophageal dilation, biopsy, etc. have also been reviewed.  Questions have been answered. All parties agreeable.     Notice: This dictation was prepared with Dragon dictation along with smaller phrase technology. Any transcriptional errors that result from this process are unintentional and may not be corrected upon review.

## 2018-07-19 NOTE — Addendum Note (Signed)
Addendum  created 07/19/18 1303 by Franco Nones, CRNA   Charge Capture section accepted

## 2018-07-21 ENCOUNTER — Encounter (HOSPITAL_COMMUNITY): Payer: Self-pay | Admitting: Internal Medicine

## 2018-08-29 ENCOUNTER — Encounter (HOSPITAL_COMMUNITY): Payer: Self-pay | Admitting: Internal Medicine

## 2018-10-25 ENCOUNTER — Ambulatory Visit: Payer: Medicare Other | Admitting: Nurse Practitioner

## 2019-01-04 ENCOUNTER — Ambulatory Visit: Payer: Medicare Other | Admitting: Nurse Practitioner

## 2019-01-04 ENCOUNTER — Ambulatory Visit (INDEPENDENT_AMBULATORY_CARE_PROVIDER_SITE_OTHER): Payer: Medicare Other | Admitting: Nurse Practitioner

## 2019-01-04 ENCOUNTER — Encounter: Payer: Self-pay | Admitting: Nurse Practitioner

## 2019-01-04 ENCOUNTER — Other Ambulatory Visit: Payer: Self-pay

## 2019-01-04 VITALS — BP 125/74 | HR 71 | Temp 97.3°F | Ht 65.0 in | Wt 235.8 lb

## 2019-01-04 DIAGNOSIS — K219 Gastro-esophageal reflux disease without esophagitis: Secondary | ICD-10-CM | POA: Diagnosis not present

## 2019-01-04 DIAGNOSIS — R1310 Dysphagia, unspecified: Secondary | ICD-10-CM

## 2019-01-04 DIAGNOSIS — R1319 Other dysphagia: Secondary | ICD-10-CM

## 2019-01-04 NOTE — Patient Instructions (Addendum)
Your health issues we discussed today were:   Dysphagia (swallowing difficulties): 1. Continue to monitor for any worsening swallowing problems 2. Let us know if you have any recurring swallowing problems  GERD (heartburn or reflux): 1. Continue taking Protonix 2. You can use Tums as needed for occasional "breakthrough" symptoms 3. Further information below related to typical triggers for heartburn  Overall I recommend:  1. Follow-up in 1 year 2. Call us if you have any questions, concerns, worsening or returning symptoms    Because of recent events of COVID-19 ("Coronavirus"), follow CDC recommendations:  1. Wash your hand frequently 2. Avoid touching your face 3. Stay away from people who are sick 4. If you have symptoms such as fever, cough, shortness of breath then call your healthcare provider for further guidance 5. If you are sick, STAY AT HOME unless otherwise directed by your healthcare provider. 6. Follow directions from state and national officials regarding staying safe    At [facility] we value your feedback. You may receive a survey about your visit today. Please share your experience as we strive to create trusting relationships with our patients to provide genuine, compassionate, quality care.  We appreciate your understanding and patience as we review any laboratory studies, imaging, and other diagnostic tests that are ordered as we care for you. Our office policy is 5 business days for review of these results, and any emergent or urgent results are addressed in a timely manner for your best interest. If you do not hear from our office in 1 week, please contact us.   We also encourage the use of MyChart, which contains your medical information for your review as well. If you are not enrolled in this feature, an access code is on this after visit summary for your convenience. Thank you for allowing [place] to be involved in your care.  It was great to see  you today!  I hope you have a great day!!      Food Choices for Gastroesophageal Reflux Disease, Adult When you have gastroesophageal reflux disease (GERD), the foods you eat and your eating habits are very important. Choosing the right foods can help ease the discomfort of GERD. Consider working with a diet and nutrition specialist (dietitian) to help you make healthy food choices. What general guidelines should I follow?  Eating plan  Choose healthy foods low in fat, such as fruits, vegetables, whole grains, low-fat dairy products, and lean meat, fish, and poultry.  Eat frequent, small meals instead of three large meals each day. Eat your meals slowly, in a relaxed setting. Avoid bending over or lying down until 2-3 hours after eating.  Limit high-fat foods such as fatty meats or fried foods.  Limit your intake of oils, butter, and shortening to less than 8 teaspoons each day.  Avoid the following: ? Foods that cause symptoms. These may be different for different people. Keep a food diary to keep track of foods that cause symptoms. ? Alcohol. ? Drinking large amounts of liquid with meals. ? Eating meals during the 2-3 hours before bed.  Cook foods using methods other than frying. This may include baking, grilling, or broiling. Lifestyle  Maintain a healthy weight. Ask your health care provider what weight is healthy for you. If you need to lose weight, work with your health care provider to do so safely.  Exercise for at least 30 minutes on 5 or more days each week, or as told by your health care provider.  Avoid wearing clothes that fit tightly around your waist and chest.  Do not use any products that contain nicotine or tobacco, such as cigarettes and e-cigarettes. If you need help quitting, ask your health care provider.  Sleep with the head of your bed raised. Use a wedge under the mattress or blocks under the bed frame to raise the head of the bed. What foods are not  recommended? The items listed may not be a complete list. Talk with your dietitian about what dietary choices are best for you. Grains Pastries or quick breads with added fat. [place] toast. Vegetables Deep fried vegetables. [place] fries. Any vegetables prepared with added fat. Any vegetables that cause symptoms. For some people this may include tomatoes and tomato products, chili peppers, onions and garlic, and horseradish. Fruits Any fruits prepared with added fat. Any fruits that cause symptoms. For some people this may include citrus fruits, such as oranges, grapefruit, pineapple, and lemons. Meats and other protein foods High-fat meats, such as fatty beef or pork, hot dogs, ribs, ham, sausage, salami and bacon. Fried meat or protein, including fried fish and fried chicken. Nuts and nut butters. Dairy Whole milk and chocolate milk. Sour cream. Cream. Ice cream. Cream cheese. Milk shakes. Beverages Coffee and tea, with or without caffeine. Carbonated beverages. Sodas. Energy drinks. Fruit juice made with acidic fruits (such as orange or grapefruit). Tomato juice. Alcoholic drinks. Fats and oils Butter. Margarine. Shortening. Ghee. Sweets and desserts Chocolate and cocoa. Donuts. Seasoning and other foods Pepper. Peppermint and spearmint. Any condiments, herbs, or seasonings that cause symptoms. For some people, this may include curry, hot sauce, or vinegar-based salad dressings. Summary  When you have gastroesophageal reflux disease (GERD), food and lifestyle choices are very important to help ease the discomfort of GERD.  Eat frequent, small meals instead of three large meals each day. Eat your meals slowly, in a relaxed setting. Avoid bending over or lying down until 2-3 hours after eating.  Limit high-fat foods such as fatty meat or fried foods. This information is not intended to replace advice given to you by your health care provider. Make sure you discuss any questions you have  with your health care provider. Document Released: 10/05/2005 Document Revised: 10/06/2016 Document Reviewed: 10/06/2016 Elsevier Interactive Patient Education  2019 [employer].

## 2019-01-04 NOTE — Progress Notes (Signed)
Referring Provider: Maximiano Coss Primary Care Physician:  Maximiano Coss, MD Primary GI:  Dr. Jena Gauss  Chief Complaint  Patient presents with  . Dysphagia  . Gastroesophageal Reflux    HPI:   Traci Thompson is a 68 y.o. female who presents for post-procedure follow-up.  The patient was last seen in our office 05/09/2018 for GERD and dysphasia.  At that time noted a longstanding history of dysphasia since 2014 at which point she underwent her first dilation.  She was unable to recall whether empiric dilation helped.  She is seen ENT who discussed possible LPR based on her description.  Also possible yeast infection as well.  Typical reflux well controlled on Protonix and Zantac.  She does have some symptoms if she eats spicy foods or at nighttime when she lays on her right side.  Solid food dysphagia no pill dysphasia.  Recommend EGD, continue pantoprazole but take on an empty stomach 30 minutes before meal.  EGD was completed 07/18/2018 which found mild erosive reflux esophagitis status post esophageal dilation, small hiatal hernia, otherwise normal.  Recommended increase Protonix to 40 mg twice daily and follow-up in 3 months.  Her previously scheduled follow-up appointment in January 2020 was canceled rebooked it today.  Today she states she's doing ok overall. Dysphagia symptoms resolved after EGD/dilation. Thinks she may have mild lactose intolerance, causes gas and therefore she tries to avoid daily. GERD symptoms doing good on Protonix, unless she eats specific triggers; she takes TUMS as needed. Denies hematochezia, melena, fever, chills, unintentional weight loss, N/V. Denies chest pain, dyspnea, dizziness, lightheadedness, syncope, near syncope. Denies any other upper or lower GI symptoms.  Past Medical History:  Diagnosis Date  . Chronic venous stasis   . Complication of anesthesia    has been difficult to put to sleep in the past  . DVT (deep venous thrombosis)  (HCC)   . Fibromyalgia   . Hyperlipidemia   . Osteoarthritis   . Restrictive lung disease    Dr. Lorenza Evangelist    Past Surgical History:  Procedure Laterality Date  . APPENDECTOMY  [address] [place]  . Black widow spider bite  2003   [place]  . Bone spur and suture abscess excision Right 2006   Right foot: [place]  . Bone spur excision  2004   Right ankle, [place]  . CARDIAC CATHETERIZATION  03/2018   [facility]: nonobstructive CAD  . COLONOSCOPY  2014   Dr. Elder Cyphers: Propofol: Left sided diverticulosis.  10-year follow-up colonoscopy recommended  . ESOPHAGOGASTRODUODENOSCOPY  2014   Dr. Elder Cyphers: Propofol.  Small hiatal hernia.  No obvious stricture or esophageal ring.  Empirically dilated with 54 bougie dilator.  Duodenal biopsy with mild focal villous blunting but no evidence of celiac. neg for H.pylori as well.  Esophageal biopsy benign.  . ESOPHAGOGASTRODUODENOSCOPY (EGD) WITH PROPOFOL N/A 07/18/2018   Procedure: ESOPHAGOGASTRODUODENOSCOPY (EGD) WITH PROPOFOL;  Surgeon: Corbin Ade, MD;  [facility]: [facility];  Service: Endoscopy;  Laterality: N/A;  11:15am  . Foot spur Left 2008   [place]  . Great saphenous laser ablation  2005   [place]  . great saphenous laser ablation Right 2012   [facility]  . KNEE ARTHROSCOPY Right 1995   [place], [place]  . MALONEY DILATION N/A 07/18/2018   Procedure: Elease Hashimoto DILATION;  Surgeon: Corbin Ade, MD;  Location: [facility];  Service: Endoscopy;  Laterality: N/A;  . Open stasis ulcer  [address], [place]  . PARTIAL HYSTERECTOMY  1980   Uterus only, [place], [place]  . SINUS EXPLORATION  1988   [place], [place]  . Skin grafts  2004   Brown recluse spider bite-lateral right ankle, open stasis ulcer-medial right ankle [facility] [place]  . TOTAL KNEE ARTHROPLASTY Right 2007   [place]  . TOTAL KNEE ARTHROPLASTY Left 2010   [place]    Current Outpatient  Medications  Medication Sig Dispense Refill  . acetaminophen (TYLENOL) 650 MG CR tablet Take 650 mg by mouth as needed for pain.    . Ascorbic Acid (VITAMIN C) 1000 MG tablet Take 1,000 mg by mouth daily.    Marland Kitchen aspirin EC 81 MG tablet Take 81 mg by mouth daily.    Marland Kitchen CALCIUM LACTATE PO Take 1-2 tablets by mouth See admin instructions. 2 tablets in the AM and 1 in the PM     . Cholecalciferol (VITAMIN D3) 5000 units TABS Take 10,000 Units by mouth daily. 2 -1. once a day     . ezetimibe (ZETIA) 10 MG tablet Take 10 mg by mouth daily.    . Flaxseed, Linseed, (FLAX SEEDS PO) Take by mouth. 2 tablespoons once a day    . furosemide (LASIX) 40 MG tablet Take 40 mg by mouth daily as needed for fluid.     Marland Kitchen guaiFENesin (MUCINEX) 600 MG 12 hr tablet Take 600 mg by mouth 2 (two) times daily as needed for cough.     . meclizine (ANTIVERT) 25 MG tablet Take 25 mg by mouth 2 (two) times daily as needed for dizziness.     . Misc Natural Products (LUTEIN 20 PO) Take 1 tablet by mouth daily.    . Multiple Vitamins-Minerals (AIRBORNE PO) Take by mouth as needed.    . Multiple Vitamins-Minerals (MULTIVITAL) tablet Take 3 tablets by mouth daily.    . Omega-3 Fatty Acids (OMEGA 3 PO) Take 1 tablet by mouth 2 (two) times daily.    . pantoprazole (PROTONIX) 40 MG tablet Take 40 mg by mouth daily.    . Probiotic Product (PROBIOTIC PO) Take 1 capsule by mouth daily.     No current facility-administered medications for this visit.     Allergies as of 01/04/2019 - Review Complete 01/04/2019  Allergen Reaction Noted  . Adhesive [tape]  05/09/2018  . Benzalkonium Itching and Rash 05/09/2018  . Cefdinir Other (See Comments) 05/09/2018  . Cephalexin Nausea Only 05/09/2018  . Codeine Nausea Only 05/09/2018  . Latex  05/09/2018  . Methylprednisolone Nausea Only 05/09/2018  . Morphine Nausea Only 05/09/2018  . Rifampin Nausea Only 05/09/2018  . Warfarin sodium Nausea Only 05/09/2018  . Prednisone  01/04/2019  .  Propoxyphene  05/09/2018    Family History  Problem Relation Age of Onset  . Urinary tract infection Mother 77       died from UTI/infection  . Leukemia Father 27  . Colon cancer Neg Hx     Social History   Socioeconomic History  . Marital status: Married    Spouse name: Not on file  . Number of children: Not on file  . Years of education: Not on file  . Highest education level: Not on file  Occupational History  . Occupation: retired Scientist, research (physical sciences)  Social Needs  . Financial resource strain: Not on file  . Food insecurity:    Worry: Not on file    Inability: Not on file  . Transportation needs:  Medical: Not on file    Non-medical: Not on file  Tobacco Use  . Smoking status: Never Smoker  . Smokeless tobacco: Never Used  Substance and Sexual Activity  . Alcohol use: Never    Frequency: Never  . Drug use: Never  . Sexual activity: Not on file  Lifestyle  . Physical activity:    Days per week: Not on file    Minutes per session: Not on file  . Stress: Not on file  Relationships  . Social connections:    Talks on phone: Not on file    Gets together: Not on file    Attends religious service: Not on file    Active member of club or organization: Not on file    Attends meetings of clubs or organizations: Not on file    Relationship status: Not on file  Other Topics Concern  . Not on file  Social History Narrative  . Not on file    Review of Systems: General: Negative for anorexia, weight loss, fever, chills, fatigue, weakness. ENT: Negative for hoarseness, difficulty swallowing CV: Negative for chest pain, angina, palpitations, peripheral edema.  Respiratory: Negative for dyspnea at rest, cough, sputum, wheezing.  GI: See history of present illness. Endo: Negative for unusual weight change.  Heme: Negative for bruising or bleeding. Allergy: Negative for rash or hives.   Physical Exam: BP 125/74   Pulse 71   Temp (!) 97.3 F (36.3 C) (Oral)    Ht  (1.651 m)   Wt 235 lb 12.8 oz (107 kg)   BMI 39.24 kg/m  General:   Alert and oriented. Pleasant and cooperative. Well-nourished and well-developed.  Eyes:  Without icterus, sclera clear and conjunctiva pink.  Ears:  Normal auditory acuity. Cardiovascular:  S1, S2 present without murmurs appreciated. Extremities without clubbing or edema. Respiratory:  Clear to auscultation bilaterally. No wheezes, rales, or rhonchi. No distress.  Gastrointestinal:  +BS, soft, non-tender and non-distended. No HSM noted. No guarding or rebound. No masses appreciated.  Rectal:  Deferred  Musculoskalatal:  Symmetrical without gross deformities. Neurologic:  Alert and oriented x4;  grossly normal neurologically. Psych:  Alert and cooperative. Normal mood and affect. Heme/Lymph/Immune: No excessive bruising noted.    01/04/2019 2:31 PM   Disclaimer: This note was dictated with voice recognition software. Similar sounding words can inadvertently be transcribed and may not be corrected upon review.

## 2019-01-04 NOTE — Assessment & Plan Note (Signed)
Dysphagia essentially resolved at this time with EGD/dilation 6 months ago.  No further complaints.  Return for follow-up in 1 year, continue to monitor for recurrence.

## 2019-01-04 NOTE — Assessment & Plan Note (Signed)
GERD symptoms doing well.  Does have occasional breakthrough with specific triggers for which she takes Tums as needed.  No need for changes in her medication.  I will provide her with printed information related to trigger avoidance.  Follow-up in 1 year.

## 2019-01-05 NOTE — Progress Notes (Signed)
cc'd to pcp 

## 2019-03-06 ENCOUNTER — Telehealth: Payer: Self-pay | Admitting: Internal Medicine

## 2019-03-06 DIAGNOSIS — K219 Gastro-esophageal reflux disease without esophagitis: Secondary | ICD-10-CM

## 2019-03-06 MED ORDER — FAMOTIDINE 20 MG PO TABS: 20.0000 mg | ORAL_TABLET | Freq: Two times a day (BID) | ORAL | 5 refills | Status: DC

## 2019-03-06 NOTE — Telephone Encounter (Signed)
Routing message to EG  

## 2019-03-06 NOTE — Telephone Encounter (Signed)
Rx sent to pharmacy for Pepcid

## 2019-03-06 NOTE — Addendum Note (Signed)
Addended by: Delane Ginger, Leshia Kope A on: 03/06/2019 03:24 PM   Modules accepted: Orders

## 2019-03-06 NOTE — Telephone Encounter (Signed)
[phone] PATIENT SAID HER RANITADINE HAS BEEN RECALLED AND SHE NEEDS SOMETHING ELSE SENT IN TO [facility]

## 2019-03-07 NOTE — Telephone Encounter (Signed)
Noted. Pt notified.  

## 2019-03-08 ENCOUNTER — Telehealth: Payer: Self-pay | Admitting: Internal Medicine

## 2019-03-08 NOTE — Telephone Encounter (Signed)
Spoke with pt. She was prescribed Pepcid in place of her medication she previously took at night. Pt is currently taking Pantoprazole and wanted to know if she needs to d/c the Pantoprazole. Pt is doing well on Pantoprazole and can stay on it in the AM and Pepcid at night.

## 2019-03-08 NOTE — Telephone Encounter (Signed)
Please call patient, she has a question about her medication

## 2019-04-04 ENCOUNTER — Telehealth: Payer: Self-pay

## 2019-04-04 DIAGNOSIS — K219 Gastro-esophageal reflux disease without esophagitis: Secondary | ICD-10-CM

## 2019-04-04 MED ORDER — FAMOTIDINE 20 MG PO TABS: 20.0000 mg | ORAL_TABLET | Freq: Two times a day (BID) | ORAL | 3 refills | Status: DC

## 2019-04-04 MED ORDER — PANTOPRAZOLE SODIUM 40 MG PO TBEC: 40.0000 mg | DELAYED_RELEASE_TABLET | Freq: Every day | ORAL | 3 refills | Status: DC

## 2019-04-04 NOTE — Telephone Encounter (Signed)
Pt called office requesting refill for Famotidine 20mg  bid. She also takes Pantoprazole 40mg  daily that was prescribed by her ENT. She wants to know if our office can prescribe the Pantoprazole also. [facility] [place] on [address]

## 2019-04-04 NOTE — Telephone Encounter (Signed)
rx sent for pantoprazole and famotidine

## 2019-04-05 NOTE — Telephone Encounter (Signed)
Called and informed pt.  

## 2019-07-11 ENCOUNTER — Telehealth: Payer: Self-pay | Admitting: Internal Medicine

## 2019-07-11 NOTE — Telephone Encounter (Signed)
[place] PANTOPROZOLE

## 2019-07-11 NOTE — Telephone Encounter (Signed)
Spoke with pt. Pt wasn't sure when her last refill was sent to the pharmacy. Pt would like to make sure the Pantoprazole is coming from our office and not her PCP, who previously prescribed the mediation. Pantoprazole was called in to pt's pharmacy 03/2019, #90 day supply with 3 rfs. Pt is going to call her pharmacy for a refill.

## 2019-10-09 ENCOUNTER — Other Ambulatory Visit: Payer: Self-pay | Admitting: *Deleted

## 2019-10-09 NOTE — Telephone Encounter (Signed)
[facility] 40mg  1 po BID #[id number]

## 2019-10-10 NOTE — Telephone Encounter (Signed)
Need clarification on dosage as we had her taking pantoprazole 40mg  ONCE daily.

## 2019-10-11 MED ORDER — PANTOPRAZOLE SODIUM 40 MG PO TBEC: 40.0000 mg | DELAYED_RELEASE_TABLET | Freq: Every day | ORAL | 3 refills | Status: DC

## 2019-10-11 NOTE — Telephone Encounter (Signed)
Called pt, she takes Pantoprazole ONCE a day.

## 2019-10-11 NOTE — Addendum Note (Signed)
Addended by: Mahala Menghini on: 10/11/2019 10:14 AM   Modules accepted: Orders

## 2019-10-25 ENCOUNTER — Other Ambulatory Visit: Payer: Self-pay | Admitting: Orthopedic Surgery

## 2019-10-25 DIAGNOSIS — M5416 Radiculopathy, lumbar region: Secondary | ICD-10-CM

## 2019-11-07 ENCOUNTER — Other Ambulatory Visit: Payer: Self-pay

## 2019-11-07 ENCOUNTER — Ambulatory Visit
Admission: RE | Admit: 2019-11-07 | Discharge: 2019-11-07 | Disposition: A | Discharge status: Home / Self Care | Payer: Medicare Other | Source: Ambulatory Visit | Attending: Orthopedic Surgery | Admitting: Orthopedic Surgery

## 2019-11-07 DIAGNOSIS — M5416 Radiculopathy, lumbar region: Secondary | ICD-10-CM

## 2019-11-13 ENCOUNTER — Other Ambulatory Visit: Payer: Self-pay | Admitting: Orthopedic Surgery

## 2019-11-13 DIAGNOSIS — M533 Sacrococcygeal disorders, not elsewhere classified: Secondary | ICD-10-CM
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
## 2019-11-22 ENCOUNTER — Encounter: Payer: Self-pay | Admitting: Neurology

## 2019-11-22 ENCOUNTER — Ambulatory Visit
Admission: RE | Admit: 2019-11-22 | Discharge: 2019-11-22 | Disposition: A | Discharge status: Home / Self Care | Payer: Medicare Other | Source: Ambulatory Visit | Attending: Orthopedic Surgery | Admitting: Orthopedic Surgery

## 2019-11-22 ENCOUNTER — Telehealth: Payer: Self-pay | Admitting: Neurology

## 2019-11-22 ENCOUNTER — Other Ambulatory Visit: Payer: Self-pay

## 2019-11-22 ENCOUNTER — Ambulatory Visit (INDEPENDENT_AMBULATORY_CARE_PROVIDER_SITE_OTHER): Payer: Medicare Other | Admitting: Neurology

## 2019-11-22 VITALS — BP 127/75 | HR 86 | Temp 96.9°F | Ht 65.0 in | Wt 257.0 lb

## 2019-11-22 DIAGNOSIS — H539 Unspecified visual disturbance: Secondary | ICD-10-CM

## 2019-11-22 DIAGNOSIS — R29810 Facial weakness: Secondary | ICD-10-CM

## 2019-11-22 DIAGNOSIS — H532 Diplopia: Secondary | ICD-10-CM

## 2019-11-22 DIAGNOSIS — M533 Sacrococcygeal disorders, not elsewhere classified: Secondary | ICD-10-CM

## 2019-11-22 NOTE — Patient Instructions (Addendum)
MRI of the brain and blood vessels Small angle strabismus causing diplopia likely per ophthalmology   Stroke Prevention Some medical conditions and behaviors are associated with a higher chance of having a stroke. You can help prevent a stroke by making nutrition, lifestyle, and other changes, including managing any medical conditions you may have. What nutrition changes can be made?   Eat healthy foods. You can do this by: ? Choosing foods high in fiber, such as fresh fruits and vegetables and whole grains. ? Eating at least 5 or more servings of fruits and vegetables a day. Try to fill half of your plate at each meal with fruits and vegetables. ? Choosing lean protein foods, such as lean cuts of meat, poultry without skin, fish, tofu, beans, and nuts. ? Eating low-fat dairy products. ? Avoiding foods that are high in salt (sodium). This can help lower blood pressure. ? Avoiding foods that have saturated fat, trans fat, and cholesterol. This can help prevent high cholesterol. ? Avoiding processed and premade foods.  Follow your health care provider's specific guidelines for losing weight, controlling high blood pressure (hypertension), lowering high cholesterol, and managing diabetes. These may include: ? Reducing your daily calorie intake. ? Limiting your daily sodium intake to 1,500 milligrams (mg). ? Using only healthy fats for cooking, such as olive oil, canola oil, or sunflower oil. ? Counting your daily carbohydrate intake. What lifestyle changes can be made?  Maintain a healthy weight. Talk to your health care provider about your ideal weight.  Get at least 30 minutes of moderate physical activity at least 5 days a week. Moderate activity includes brisk walking, biking, and swimming.  Do not use any products that contain nicotine or tobacco, such as cigarettes and e-cigarettes. If you need help quitting, ask your health care provider. It may also be helpful to avoid exposure to  secondhand smoke.  Limit alcohol intake to no more than 1 drink a day for nonpregnant women and 2 drinks a day for men. One drink equals 12 oz of beer, 5 oz of wine, or 1 oz of hard liquor.  Stop any illegal drug use.  Avoid taking birth control pills. Talk to your health care provider about the risks of taking birth control pills if: ? You are over 44 years old. ? You smoke. ? You get migraines. ? You have ever had a blood clot. What other changes can be made?  Manage your cholesterol levels. ? Eating a healthy diet is important for preventing high cholesterol. If cholesterol cannot be managed through diet alone, you may also need to take medicines. ? Take any prescribed medicines to control your cholesterol as told by your health care provider.  Manage your diabetes. ? Eating a healthy diet and exercising regularly are important parts of managing your blood sugar. If your blood sugar cannot be managed through diet and exercise, you may need to take medicines. ? Take any prescribed medicines to control your diabetes as told by your health care provider.  Control your hypertension. ? To reduce your risk of stroke, try to keep your blood pressure below 130/80. ? Eating a healthy diet and exercising regularly are an important part of controlling your blood pressure. If your blood pressure cannot be managed through diet and exercise, you may need to take medicines. ? Take any prescribed medicines to control hypertension as told by your health care provider. ? Ask your health care provider if you should monitor your blood pressure at home. ?  Have your blood pressure checked every year, even if your blood pressure is normal. Blood pressure increases with age and some medical conditions.  Get evaluated for sleep disorders (sleep apnea). Talk to your health care provider about getting a sleep evaluation if you snore a lot or have excessive sleepiness.  Take over-the-counter and prescription  medicines only as told by your health care provider. Aspirin or blood thinners (antiplatelets or anticoagulants) may be recommended to reduce your risk of forming blood clots that can lead to stroke.  Make sure that any other medical conditions you have, such as atrial fibrillation or atherosclerosis, are managed. What are the warning signs of a stroke? The warning signs of a stroke can be easily remembered as BEFAST.  B is for balance. Signs include: ? Dizziness. ? Loss of balance or coordination. ? Sudden trouble walking.  E is for eyes. Signs include: ? A sudden change in vision. ? Trouble seeing.  F is for face. Signs include: ? Sudden weakness or numbness of the face. ? The face or eyelid drooping to one side.  A is for arms. Signs include: ? Sudden weakness or numbness of the arm, usually on one side of the body.  S is for speech. Signs include: ? Trouble speaking (aphasia). ? Trouble understanding.  T is for time. ? These symptoms may represent a serious problem that is an emergency. Do not wait to see if the symptoms will go away. Get medical help right away. Call your local emergency services (911 in the [place] Do not drive yourself to the hospital.  Other signs of stroke may include: ? A sudden, severe headache with no known cause. ? Nausea or vomiting. ? Seizure. Where to find more information For more information, visit:  American Stroke Association: www.strokeassociation.org  National Stroke Association: www.stroke.org Summary  You can prevent a stroke by eating healthy, exercising, not smoking, limiting alcohol intake, and managing any medical conditions you may have.  Do not use any products that contain nicotine or tobacco, such as cigarettes and e-cigarettes. If you need help quitting, ask your health care provider. It may also be helpful to avoid exposure to secondhand smoke.  Remember BEFAST for warning signs of stroke. Get help right away if you or a  loved one has any of these signs. This information is not intended to replace advice given to you by your health care provider. Make sure you discuss any questions you have with your health care provider. Document Revised: 09/17/2017 Document Reviewed: 11/10/2016 Elsevier Patient Education  2020 [employer].

## 2019-11-22 NOTE — Telephone Encounter (Signed)
Medicare/gpm order sent to GI. No auth they will reach out to the patient to schedule.

## 2019-12-04 ENCOUNTER — Encounter: Payer: Self-pay | Admitting: Internal Medicine

## 2019-12-04 LAB — ACETYLCHOLINE RECEPTOR, BLOCKING: Acetylchol Block Ab: 20 % (ref 0–25)

## 2019-12-04 LAB — BASIC METABOLIC PANEL
BUN/Creatinine Ratio: 24 (ref 12–28)
BUN: 19 mg/dL (ref 8–27)
CO2: 26 mmol/L (ref 20–29)
Calcium: 10.1 mg/dL (ref 8.7–10.3)
Chloride: 102 mmol/L (ref 96–106)
Creatinine, Ser: 0.8 mg/dL (ref 0.57–1.00)
GFR calc Af Amer: 86 mL/min/{1.73_m2} (ref >59)
GFR calc non Af Amer: 75 mL/min/{1.73_m2} (ref >59)
Glucose: 92 mg/dL (ref 65–99)
Potassium: 5 mmol/L (ref 3.5–5.2)
Sodium: 140 mmol/L (ref 134–144)

## 2019-12-04 LAB — ACETYLCHOLINE RECEPTOR, BINDING: AChR Binding Ab, Serum: 0.05 nmol/L (ref 0.00–0.24)

## 2019-12-04 LAB — ACETYLCHOLINE RECEPTOR, MODULATING: Acetylcholine Modulat Ab: <12 % (ref 0–20)

## 2019-12-04 NOTE — Progress Notes (Signed)
Labs are normal, thanks

## 2019-12-18 ENCOUNTER — Other Ambulatory Visit: Payer: Medicare Other

## 2020-01-04 ENCOUNTER — Telehealth: Payer: Self-pay | Admitting: Internal Medicine

## 2020-01-04 DIAGNOSIS — K219 Gastro-esophageal reflux disease without esophagitis: Secondary | ICD-10-CM

## 2020-01-04 NOTE — Telephone Encounter (Signed)
Patient has questions about side effects of pantoprazole. Please call [phone]

## 2020-01-05 NOTE — Telephone Encounter (Signed)
Lmom, waiting on a return call.  

## 2020-01-08 NOTE — Telephone Encounter (Signed)
Lmom, waiting on a return call.  

## 2020-01-12 MED ORDER — OMEPRAZOLE 40 MG PO CPDR: 40.0000 mg | DELAYED_RELEASE_CAPSULE | Freq: Every day | ORAL | 3 refills | Status: DC

## 2020-01-12 NOTE — Addendum Note (Signed)
Addended by: Delane Ginger, Kiri Hinderliter A on: 01/12/2020 01:30 PM   Modules accepted: Orders

## 2020-01-12 NOTE — Telephone Encounter (Signed)
Pt returned call. Pt has been taking Pantoprazole qam and Pepcid at night. Pt has been having some symptoms and read the side effects for Pantoprazole. Pt has been having fatigue, SOB and would like to change her PPI.   Ok to leave message on pts machine, pt is caring for her ill mother in law.

## 2020-01-12 NOTE — Telephone Encounter (Signed)
New Rx for omeprazole 40 mg daily sent to pharmacy

## 2020-01-13 ENCOUNTER — Ambulatory Visit
Admission: RE | Admit: 2020-01-13 | Discharge: 2020-01-13 | Disposition: A | Discharge status: Home / Self Care | Payer: Medicare Other | Source: Ambulatory Visit | Attending: Neurology | Admitting: Neurology

## 2020-01-13 ENCOUNTER — Other Ambulatory Visit: Payer: Self-pay

## 2020-01-13 DIAGNOSIS — H539 Unspecified visual disturbance: Secondary | ICD-10-CM

## 2020-01-13 DIAGNOSIS — R29810 Facial weakness: Secondary | ICD-10-CM | POA: Diagnosis not present

## 2020-01-13 DIAGNOSIS — H532 Diplopia: Secondary | ICD-10-CM | POA: Diagnosis not present

## 2020-01-13 MED ORDER — GADOBENATE DIMEGLUMINE 529 MG/ML IV SOLN
20.0000 mL | Freq: Once | INTRAVENOUS | Status: AC | PRN
  Administered 2020-01-13: 20 mL via INTRAVENOUS

## 2020-01-15 NOTE — Telephone Encounter (Signed)
Spoke with pt. Pt notified that Omeprazole 40 mg was sent to pts pharmacy.

## 2020-02-16 ENCOUNTER — Other Ambulatory Visit: Payer: Self-pay | Admitting: Nurse Practitioner

## 2020-02-16 DIAGNOSIS — K219 Gastro-esophageal reflux disease without esophagitis: Secondary | ICD-10-CM

## 2020-03-19 ENCOUNTER — Other Ambulatory Visit: Payer: Self-pay

## 2020-03-19 ENCOUNTER — Ambulatory Visit (INDEPENDENT_AMBULATORY_CARE_PROVIDER_SITE_OTHER): Payer: Medicare Other | Admitting: Nurse Practitioner

## 2020-03-19 ENCOUNTER — Encounter: Payer: Self-pay | Admitting: Nurse Practitioner

## 2020-03-19 VITALS — BP 137/75 | HR 92 | Temp 96.9°F | Ht 65.0 in | Wt 270.0 lb

## 2020-03-19 DIAGNOSIS — R140 Abdominal distension (gaseous): Secondary | ICD-10-CM

## 2020-03-19 DIAGNOSIS — K219 Gastro-esophageal reflux disease without esophagitis: Secondary | ICD-10-CM

## 2020-03-19 NOTE — Patient Instructions (Addendum)
Your health issues we discussed today were:   GERD (reflux/heartburn): 1. I am glad your reflux symptoms overall are doing better 2. Continue taking omeprazole 40 mg daily 3. When you follow-up, we can discuss possibly reducing this to 20 mg daily to see if your symptoms are still controlled on a lower dose 4. Avoid triggers that tend to make your reflux worse 5. You can use Tums as needed for "breakthrough" GERD symptoms 6. Further dietary information related to GERD is printed below  Bloating and gas: 1. As we discussed, you should keep a food diary for the next couple weeks 2. When you have significant bloating and gas symptoms, make a note of this in your diary to help identify possible common foods that you have eaten recently around your symptoms 3. If you think there is a food that is causing worsening bloating and gas symptoms you can try to eliminate it for several days to see if this improves your symptoms 4. If your symptoms improve, try adding the food back in and see if you develop symptoms after that 5. In the meantime, to help with your symptoms you can try Gas-X or Beano, both of which are available over-the-counter 6. Call for any worsening or severe symptoms  Overall I recommend:  1. Continue your other current medications 2. Return for follow-up in 6 months next  3. Call us if you have any questions or concerns.   At [facility] we value your feedback. You may receive a survey about your visit today. Please share your experience as we strive to create trusting relationships with our patients to provide genuine, compassionate, quality care.  We appreciate your understanding and patience as we review any laboratory studies, imaging, and other diagnostic tests that are ordered as we care for you. Our office policy is 5 business days for review of these results, and any emergent or urgent results are addressed in a timely manner for your best interest. If you  do not hear from our office in 1 week, please contact us.   We also encourage the use of MyChart, which contains your medical information for your review as well. If you are not enrolled in this feature, an access code is on this after visit summary for your convenience. Thank you for allowing [place] to be involved in your care.  It was great to see you today!  I hope you have a great Summer!!       Food Choices for Gastroesophageal Reflux Disease, Adult When you have gastroesophageal reflux disease (GERD), the foods you eat and your eating habits are very important. Choosing the right foods can help ease the discomfort of GERD. Consider working with a diet and nutrition specialist (dietitian) to help you make healthy food choices. What general guidelines should I follow?  Eating plan  Choose healthy foods low in fat, such as fruits, vegetables, whole grains, low-fat dairy products, and lean meat, fish, and poultry.  Eat frequent, small meals instead of three large meals each day. Eat your meals slowly, in a relaxed setting. Avoid bending over or lying down until 2-3 hours after eating.  Limit high-fat foods such as fatty meats or fried foods.  Limit your intake of oils, butter, and shortening to less than 8 teaspoons each day.  Avoid the following: ? Foods that cause symptoms. These may be different for different people. Keep a food diary to keep track of foods that cause symptoms. ? Alcohol. ? Drinking large amounts  of liquid with meals. ? Eating meals during the 2-3 hours before bed.  Cook foods using methods other than frying. This may include baking, grilling, or broiling. Lifestyle  Maintain a healthy weight. Ask your health care provider what weight is healthy for you. If you need to lose weight, work with your health care provider to do so safely.  Exercise for at least 30 minutes on 5 or more days each week, or as told by your health care provider.  Avoid wearing clothes  that fit tightly around your waist and chest.  Do not use any products that contain nicotine or tobacco, such as cigarettes and e-cigarettes. If you need help quitting, ask your health care provider.  Sleep with the head of your bed raised. Use a wedge under the mattress or blocks under the bed frame to raise the head of the bed. What foods are not recommended? The items listed may not be a complete list. Talk with your dietitian about what dietary choices are best for you. Grains Pastries or quick breads with added fat. [place] toast. Vegetables Deep fried vegetables. [place] fries. Any vegetables prepared with added fat. Any vegetables that cause symptoms. For some people this may include tomatoes and tomato products, chili peppers, onions and garlic, and horseradish. Fruits Any fruits prepared with added fat. Any fruits that cause symptoms. For some people this may include citrus fruits, such as oranges, grapefruit, pineapple, and lemons. Meats and other protein foods High-fat meats, such as fatty beef or pork, hot dogs, ribs, ham, sausage, salami and bacon. Fried meat or protein, including fried fish and fried chicken. Nuts and nut butters. Dairy Whole milk and chocolate milk. Sour cream. Cream. Ice cream. Cream cheese. Milk shakes. Beverages Coffee and tea, with or without caffeine. Carbonated beverages. Sodas. Energy drinks. Fruit juice made with acidic fruits (such as orange or grapefruit). Tomato juice. Alcoholic drinks. Fats and oils Butter. Margarine. Shortening. Ghee. Sweets and desserts Chocolate and cocoa. Donuts. Seasoning and other foods Pepper. Peppermint and spearmint. Any condiments, herbs, or seasonings that cause symptoms. For some people, this may include curry, hot sauce, or vinegar-based salad dressings. Summary  When you have gastroesophageal reflux disease (GERD), food and lifestyle choices are very important to help ease the discomfort of GERD.  Eat frequent,  small meals instead of three large meals each day. Eat your meals slowly, in a relaxed setting. Avoid bending over or lying down until 2-3 hours after eating.  Limit high-fat foods such as fatty meat or fried foods. This information is not intended to replace advice given to you by your health care provider. Make sure you discuss any questions you have with your health care provider. Document Revised: 01/26/2019 Document Reviewed: 10/06/2016 Elsevier Patient Education  [place].

## 2020-03-19 NOTE — Progress Notes (Signed)
Referring Provider: Maximiano Coss Primary Care Physician:  Maximiano Coss, MD Primary GI:  Dr. Jena Gauss  Chief Complaint  Patient presents with  . Gastroesophageal Reflux    rare    HPI:   Traci Thompson is a 69 y.o. female who presents for follow-up. The patient was last seen in our office 01/04/2019 for GERD and dysphagia.  Previously noted longstanding history of dysphagia since 2014 when she had her first esophageal dilation.  Has seen ENT who discussed possible LPR based on her description.  Typical reflux symptoms controlled on Protonix and Zantac.  Most recent EGD completed 07/18/2018 which found mild erosive reflux esophagitis status post operative dilation, small hiatal hernia, otherwise normal.  Recommended increase Protonix to 40 mg twice a day and follow-up in 3 months.  At her last visit she noted symptoms of dysphagia resolved after dilation.  Think she might have mild lactose intolerance causing gas and so she avoids dairy.  GERD symptoms doing well on Protonix unless she eats a specific trigger which she typically tries to avoid.  She does use Tums as needed.  No other overt GI complaints.  Recommended continue Protonix, Tums as needed, follow-up in 1 year and notify us of any worsening symptoms.  Later in 2020 ranitidine was recalled from the market due to contamination and Pepcid was substituted.  After years of treatment the patient called our office 01/04/2020 indicating she has been having some symptoms and upon further reading she found these are possible side effects of pantoprazole including fatigue, shortness of breath.  Requested change of PPI.  She was switched to omeprazole 40 mg daily.  No further communication from the patient.  Today she states she's doing ok overall. Omeprazole controls her symptoms quite well, rare breakthough. She is asking about incisionless endoscopic fundoplication. Her husband is seeing a neurologist who recommended a "MIND"  diet (meditteranian/DASH combination) but she has baseline gas. Feels this is getting worse and is asking what's causing this. She has added flax. She seems to be asking about diet recommendations for GERD. Discussed trigger avoidance, will provide recommendations. She had a 2:00 am flare recently without known trigger, used TUMS which did help. She seems most concerned about her uncontrollable flatulence. Denies overt abdominal pain, N/V, hematochezia, melena, fever, chills, unintentional weight loss. Denies URI or flu-like symptoms. Denies loss of sense of taste or smell. The patient has not received COVID-19 vaccination(s). Denies chest pain, dyspnea, dizziness, lightheadedness, syncope, near syncope. Denies any other upper or lower GI symptoms.  Past Medical History:  Diagnosis Date  . Chronic venous stasis   . Complication of anesthesia    has been difficult to put to sleep in the past  . DVT (deep venous thrombosis) (HCC)   . Fibromyalgia   . Hyperlipidemia   . Osteoarthritis   . Restrictive lung disease    Dr. Lorenza Evangelist    Past Surgical History:  Procedure Laterality Date  . ABI  01/04/2015   looking for PAD, [facility]. Dr. Teofilo Pod.   . APPENDECTOMY  [address]  . Black widow spider bite  2003   [place]  . Bone spur and suture abscess excision Right 2006   Right foot: [place]  . Bone spur excision  2004   Right ankle, [place]  . CARDIAC CATHETERIZATION  03/2018   [facility]: nonobstructive CAD  . COLONOSCOPY  2014   Dr. Elder Cyphers: Propofol: Left sided diverticulosis.  10-year follow-up  colonoscopy recommended  . ESOPHAGOGASTRODUODENOSCOPY  2014   Dr. Elder Cyphers: Propofol.  Small hiatal hernia.  No obvious stricture or esophageal ring.  Empirically dilated with 54 bougie dilator.  Duodenal biopsy with mild focal villous blunting but no evidence of celiac. neg for H.pylori as well.  Esophageal biopsy benign.  . ESOPHAGOGASTRODUODENOSCOPY  (EGD) WITH PROPOFOL N/A 07/18/2018   Procedure: ESOPHAGOGASTRODUODENOSCOPY (EGD) WITH PROPOFOL;  Surgeon: Corbin Ade, MD;  [facility];  Service: Endoscopy;  Laterality: N/A;  11:15am  . Foot spur Left 2008   [place]  . Great saphenous laser ablation  2005   [place]  . great saphenous laser ablation Right 2012   [facility]  . KNEE ARTHROSCOPY Right 1995   [place], [place]  . left foot resection of achilles tendon spur  05/06/2007   [place]  . MALONEY DILATION N/A 07/18/2018   Procedure: Elease Hashimoto DILATION;  Surgeon: Corbin Ade, MD;  Location: [facility];  Service: Endoscopy;  Laterality: N/A;  . Open stasis ulcer  1982   [place], [place]  . PARTIAL HYSTERECTOMY  1980   Uterus only, [place], [place]  . SINUS EXPLORATION  1988   [place], [place]  . Skin grafts  2004   Brown recluse spider bite-lateral right ankle, open stasis ulcer-medial right ankle [facility] [place])  . TOTAL KNEE ARTHROPLASTY Right 2007   [place]  . TOTAL KNEE ARTHROPLASTY Left 2010   [place]  . venous reflux  01/07/2015   [facility], Dr. Teofilo Pod     Current Outpatient Medications  Medication Sig Dispense Refill  . acetaminophen (TYLENOL) 650 MG CR tablet Take 1,300 mg by mouth every 8 (eight) hours as needed for pain.     . Alirocumab (PRALUENT) 150 MG/ML SOAJ Inject into the skin every 14 (fourteen) days.    . Ascorbic Acid (VITAMIN C) 1000 MG tablet Take 1,000 mg by mouth daily.    Marland Kitchen aspirin EC 81 MG tablet Take 81 mg by mouth daily.    Marland Kitchen CALCIUM LACTATE PO Take 1-2 tablets by mouth See admin instructions. 2 tablets in the AM and 1 in the PM     . Cholecalciferol (VITAMIN D3) 5000 units TABS Take 10,000 Units by mouth daily.     Marland Kitchen ezetimibe (ZETIA) 10 MG tablet Take 10 mg by mouth daily.    . famotidine (PEPCID) 20 MG tablet Take 1 tablet (20 mg total) by mouth 2 (two) times daily. (Patient taking differently: Take 20 mg by mouth  at bedtime. ) 180 tablet 3  . furosemide (LASIX) 40 MG tablet Take 40 mg by mouth daily as needed for fluid.     Marland Kitchen guaiFENesin (MUCINEX) 600 MG 12 hr tablet Take 600 mg by mouth 2 (two) times daily as needed for cough.     . meclizine (ANTIVERT) 25 MG tablet Take 25 mg by mouth 2 (two) times daily as needed for dizziness.     . meloxicam (MOBIC) 15 MG tablet Take 15 mg by mouth as needed for pain.    . Multiple Vitamins-Minerals (AIRBORNE PO) Take by mouth as needed.    . Multiple Vitamins-Minerals (MULTIVITAL) tablet Take 3 tablets by mouth daily.    . Multiple Vitamins-Minerals (PRESERVISION AREDS 2 PO) Take by mouth 2 (two) times daily.    . Omega-3 Fatty Acids (OMEGA 3 PO) Take 1 tablet by mouth 2 (two) times daily.    Marland Kitchen omeprazole (PRILOSEC) 40 MG capsule TAKE  1 CAPSULE BY MOUTH EVERY DAY 90 capsule 1  . Probiotic Product (PROBIOTIC PO) Take 1 capsule by mouth daily.    . Pseudoephedrine HCl (WAL-PHED PO) Take 1 tablet by mouth daily as needed.    Marland Kitchen UNABLE TO FIND 3 tablets daily. Med Name: Catalyn multivitamin multi-mineral    . UNABLE TO FIND Place 1 drop into both eyes as needed. Med Name: Moxeza/Moxifloxacin 0.05% eye drops 3 mL.    . clotrimazole-betamethasone (LOTRISONE) cream Apply 1 application topically 2 (two) times daily.     No current facility-administered medications for this visit.    Allergies as of 03/19/2020 - Review Complete 03/19/2020  Allergen Reaction Noted  . Morphine Nausea Only, Rash, and Other (See Comments) 05/09/2018  . Prednisone Swelling 01/04/2019  . Doxycycline monohydrate  11/22/2019  . Other Nausea Only 11/22/2019  . Adhesive [tape] Itching and Rash 05/09/2018  . Benzalkonium Itching and Rash 05/09/2018  . Cefdinir Nausea Only and Other (See Comments) 05/09/2018  . Cephalexin Nausea Only, Rash, and Other (See Comments) 05/09/2018  . Codeine Nausea Only and Other (See Comments) 05/09/2018  . Latex Itching and Rash 05/09/2018  . Methylprednisolone  Nausea Only and Other (See Comments) 05/09/2018  . Propoxyphene Nausea Only and Other (See Comments) 05/09/2018  . Rifampin Nausea Only and Other (See Comments) 05/09/2018  . Warfarin sodium Nausea Only and Other (See Comments) 05/09/2018    Family History  Problem Relation Age of Onset  . Urinary tract infection Mother 63       died from UTI/infection  . Leukemia Father 39  . Hypercholesterolemia Other   . Heart Problems Paternal Grandmother   . Asthma Son   . Leukemia Daughter   . Endometrial cancer Daughter   . Colon cancer Neg Hx     Social History   Socioeconomic History  . Marital status: Married    Spouse name: Not on file  . Number of children: 6  . Years of education: Not on file  . Highest education level: Master's degree (e.g., MA, MS, MEng, MEd, MSW, MBA)  Occupational History  . Occupation: retired Scientist, research (physical sciences)  Tobacco Use  . Smoking status: Never Smoker  . Smokeless tobacco: Never Used  Substance and Sexual Activity  . Alcohol use: Never  . Drug use: Never  . Sexual activity: Not on file  Other Topics Concern  . Not on file  Social History Narrative   Lives at home with husband   Caffeine 1 cup of coffee/day   Social Determinants of Health   Financial Resource Strain:   . Difficulty of Paying Living Expenses:   Food Insecurity:   . Worried About Programme researcher, broadcasting/film/video in the Last Year:   . Barista in the Last Year:   Transportation Needs:   . Freight forwarder (Medical):   Marland Kitchen Lack of Transportation (Non-Medical):   Physical Activity:   . Days of Exercise per Week:   . Minutes of Exercise per Session:   Stress:   . Feeling of Stress :   Social Connections:   . Frequency of Communication with Friends and Family:   . Frequency of Social Gatherings with Friends and Family:   . Attends Religious Services:   . Active Member of Clubs or Organizations:   . Attends Banker Meetings:   Marland Kitchen Marital Status:      Subjective: Review of Systems  Constitutional: Negative for chills, fever, malaise/fatigue and weight loss.  HENT: Negative for  congestion and sore throat.   Respiratory: Negative for cough and shortness of breath.   Cardiovascular: Negative for chest pain and palpitations.  Gastrointestinal: Negative for abdominal pain, blood in stool, diarrhea, melena, nausea and vomiting.  Musculoskeletal: Negative for joint pain and myalgias.  Skin: Negative for rash.  Neurological: Negative for dizziness and weakness.  Endo/Heme/Allergies: Does not bruise/bleed easily.  Psychiatric/Behavioral: Negative for depression. The patient is not nervous/anxious.   All other systems reviewed and are negative.    Objective: BP 137/75   Pulse 92   Temp (!) 96.9 F (36.1 C) (Temporal)   Ht 5\' 5"  (1.651 m)   Wt 270 lb (122.5 kg)   BMI 44.93 kg/m  Physical Exam Vitals and nursing note reviewed.  Constitutional:      General: She is not in acute distress.    Appearance: Normal appearance. She is well-developed. She is obese. She is not ill-appearing, toxic-appearing or diaphoretic.  HENT:     Head: Normocephalic and atraumatic.     Nose: No congestion or rhinorrhea.  Eyes:     General: No scleral icterus. Cardiovascular:     Rate and Rhythm: Normal rate and regular rhythm.     Heart sounds: Normal heart sounds.  Pulmonary:     Effort: Pulmonary effort is normal. No respiratory distress.     Breath sounds: Normal breath sounds.  Abdominal:     General: Bowel sounds are normal.     Palpations: Abdomen is soft. There is no hepatomegaly, splenomegaly or mass.     Tenderness: There is no abdominal tenderness. There is no guarding or rebound.     Hernia: No hernia is present.  Skin:    General: Skin is warm and dry.     Coloration: Skin is not jaundiced.     Findings: No rash.  Neurological:     General: No focal deficit present.     Mental Status: She is alert and oriented to person, place,  and time.  Psychiatric:        Attention and Perception: Attention normal.        Mood and Affect: Mood normal.        Speech: Speech normal.        Behavior: Behavior normal.        Thought Content: Thought content normal.        Cognition and Memory: Cognition and memory normal.       03/29/2020 3:19 PM   Disclaimer: This note was dictated with voice recognition software. Similar sounding words can inadvertently be transcribed and may not be corrected upon review.

## 2020-03-29 ENCOUNTER — Encounter: Payer: Self-pay | Admitting: Nurse Practitioner

## 2020-03-29 NOTE — Assessment & Plan Note (Signed)
She notes significant bloating and uncontrollable flatulence.  This seems to be her primary concern today.  No identified triggers for this.  Discussed use of over-the-counter Gas-X or Beano (simethicone).  Keep a food diary to try to identify triggers that could be causing excessive gas.  Discussed diet to reduce cruciferous vegetables and other gas-forming foods.  Consider elimination diet based on the food diary that is kept.  Call with any questions or concerns.  Follow-up in 6 months.

## 2020-03-29 NOTE — Assessment & Plan Note (Signed)
History of GERD, omeprazole currently controlling her symptoms well with rare breakthrough.  Questions about diet were discussed and answered.  Discussed trigger avoidance, diet recommendations.  She did have a noted flare at 2 AM without a known trigger and use Tums to help.  Recommend keeping a diet journal to try to identify any particular triggers and consider elimination diet based on this.  Tums as needed.  Follow-up in 6 months.

## 2020-05-16 ENCOUNTER — Other Ambulatory Visit: Payer: Self-pay | Admitting: Gastroenterology

## 2020-05-16 DIAGNOSIS — K219 Gastro-esophageal reflux disease without esophagitis: Secondary | ICD-10-CM

## 2020-07-15 ENCOUNTER — Emergency Department (HOSPITAL_COMMUNITY): Payer: Medicare Other

## 2020-07-15 ENCOUNTER — Other Ambulatory Visit: Payer: Self-pay

## 2020-07-15 ENCOUNTER — Encounter (HOSPITAL_COMMUNITY): Payer: Self-pay | Admitting: *Deleted

## 2020-07-15 ENCOUNTER — Emergency Department (HOSPITAL_COMMUNITY)
Admission: EM | Admit: 2020-07-15 | Discharge: 2020-07-15 | Disposition: A | Discharge status: Home / Self Care | Payer: Medicare Other | Attending: Emergency Medicine | Admitting: Emergency Medicine

## 2020-07-15 DIAGNOSIS — Z96653 Presence of artificial knee joint, bilateral: Secondary | ICD-10-CM | POA: Diagnosis not present

## 2020-07-15 DIAGNOSIS — L03115 Cellulitis of right lower limb: Secondary | ICD-10-CM | POA: Insufficient documentation

## 2020-07-15 DIAGNOSIS — Z91040 Latex allergy status: Secondary | ICD-10-CM | POA: Insufficient documentation

## 2020-07-15 DIAGNOSIS — Z7982 Long term (current) use of aspirin: Secondary | ICD-10-CM | POA: Insufficient documentation

## 2020-07-15 DIAGNOSIS — Z79899 Other long term (current) drug therapy: Secondary | ICD-10-CM | POA: Insufficient documentation

## 2020-07-15 DIAGNOSIS — M79661 Pain in right lower leg: Secondary | ICD-10-CM | POA: Diagnosis present

## 2020-07-15 LAB — COMPREHENSIVE METABOLIC PANEL
ALT: 18 U/L (ref 0–44)
AST: 18 U/L (ref 15–41)
Albumin: 3.4 g/dL — ABNORMAL LOW (ref 3.5–5.0)
Alkaline Phosphatase: 63 U/L (ref 38–126)
Anion gap: 11 (ref 5–15)
BUN: 15 mg/dL (ref 8–23)
CO2: 26 mmol/L (ref 22–32)
Calcium: 9.3 mg/dL (ref 8.9–10.3)
Chloride: 101 mmol/L (ref 98–111)
Creatinine, Ser: 0.68 mg/dL (ref 0.44–1.00)
GFR calc Af Amer: >60 mL/min (ref >60)
GFR calc non Af Amer: >60 mL/min (ref >60)
Glucose, Bld: 101 mg/dL — ABNORMAL HIGH (ref 70–99)
Potassium: 4.6 mmol/L (ref 3.5–5.1)
Sodium: 138 mmol/L (ref 135–145)
Total Bilirubin: 0.6 mg/dL (ref 0.3–1.2)
Total Protein: 7 g/dL (ref 6.5–8.1)

## 2020-07-15 LAB — CBC WITH DIFFERENTIAL/PLATELET
Abs Immature Granulocytes: 0.02 10*3/uL (ref 0.00–0.07)
Basophils Absolute: 0 10*3/uL (ref 0.0–0.1)
Basophils Relative: 0 %
Eosinophils Absolute: 0.1 10*3/uL (ref 0.0–0.5)
Eosinophils Relative: 1 %
HCT: 37.5 % (ref 36.0–46.0)
Hemoglobin: 12 g/dL (ref 12.0–15.0)
Immature Granulocytes: 0 %
Lymphocytes Relative: 29 %
Lymphs Abs: 2.3 10*3/uL (ref 0.7–4.0)
MCH: 28.1 pg (ref 26.0–34.0)
MCHC: 32 g/dL (ref 30.0–36.0)
MCV: 87.8 fL (ref 80.0–100.0)
Monocytes Absolute: 0.7 10*3/uL (ref 0.1–1.0)
Monocytes Relative: 9 %
Neutro Abs: 4.7 10*3/uL (ref 1.7–7.7)
Neutrophils Relative %: 61 %
Platelets: 264 10*3/uL (ref 150–400)
RBC: 4.27 MIL/uL (ref 3.87–5.11)
RDW: 13.5 % (ref 11.5–15.5)
WBC: 7.9 10*3/uL (ref 4.0–10.5)
nRBC: 0 % (ref 0.0–0.2)

## 2020-07-15 MED ORDER — CEPHALEXIN 500 MG PO CAPS: 500.0000 mg | ORAL_CAPSULE | Freq: Four times a day (QID) | ORAL | 0 refills | Status: AC

## 2020-07-15 MED ORDER — IBUPROFEN 800 MG PO TABS
800.0000 mg | ORAL_TABLET | Freq: Once | ORAL | Status: AC
  Administered 2020-07-15: 800 mg via ORAL
  Filled 2020-07-15: qty 1

## 2020-07-15 MED ORDER — CEPHALEXIN 500 MG PO CAPS
500.0000 mg | ORAL_CAPSULE | Freq: Once | ORAL | Status: AC
  Administered 2020-07-15: 500 mg via ORAL
  Filled 2020-07-15: qty 1

## 2020-07-15 NOTE — ED Provider Notes (Signed)
[facility] EMERGENCY DEPARTMENT Provider Note   CSN: [id number] Arrival date & time: 07/15/20  1142     History Chief Complaint  Patient presents with   Leg Swelling    Traci Thompson is a 69 y.o. female with pertinent past medical history of chronic venous stasis, DVT, fibromyalgia, hyperlipidemia, osteoarthritis that presents emergency department today for right lower leg swelling and pain.  Patient states that she noticed a small red spot on her right lower leg on Thursday, has been progressively worsening since then.  States that pain is severe on the right lower side has also noticed warmth and redness to this area.  Pain does not radiate anywhere.  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  States that she is pretty active, is not very sedentary.  Is not diabetic.  Has been taking Aleve which has been helping.  Denies any fevers, chills, nausea, vomiting, chest pain, shortness of breath.  Does not member getting bitten by a bug.  States that she has bad vasculature, does constantly wear compression stockings for this.  States that she was diagnosed with a UTI on Wednesday, has been taking Bactrim for this.  No worsening factors.  Is able to bear weight, normal gait.  HPI     Past Medical History:  Diagnosis Date   Chronic venous stasis    Complication of anesthesia    has been difficult to put to sleep in the past   DVT (deep venous thrombosis) (HCC)    Fibromyalgia    Hyperlipidemia    Osteoarthritis    Restrictive lung disease    Dr. Lorenza Evangelist    Patient Active Problem List   Diagnosis Date Noted   Bloating 03/19/2020   GERD (gastroesophageal reflux disease) 05/09/2018   Esophageal dysphagia 05/09/2018    Past Surgical History:  Procedure Laterality Date   ABI  01/04/2015   looking for PAD, [facility]. Dr. Teofilo Pod.    APPENDECTOMY  [address] [place]   Black widow spider bite  2003   [place]    Bone spur and suture abscess excision Right 2006   Right foot: [place]   Bone spur excision  2004   Right ankle, [place]   CARDIAC CATHETERIZATION  03/2018   [facility]: nonobstructive CAD   COLONOSCOPY  2014   Dr. Elder Cyphers: Propofol: Left sided diverticulosis.  10-year follow-up colonoscopy recommended   ESOPHAGOGASTRODUODENOSCOPY  2014   Dr. Elder Cyphers: Propofol.  Small hiatal hernia.  No obvious stricture or esophageal ring.  Empirically dilated with 54 bougie dilator.  Duodenal biopsy with mild focal villous blunting but no evidence of celiac. neg for H.pylori as well.  Esophageal biopsy benign.   ESOPHAGOGASTRODUODENOSCOPY (EGD) WITH PROPOFOL N/A 07/18/2018   Procedure: ESOPHAGOGASTRODUODENOSCOPY (EGD) WITH PROPOFOL;  Surgeon: Corbin Ade, MD;  Location: [facility] ENDO SUITE;  Service: Endoscopy;  Laterality: N/A;  11:15am   Foot spur Left 2008   [facility] saphenous laser ablation  2005   [place]   great saphenous laser ablation Right 2012   [facility]   KNEE ARTHROSCOPY Right 1995   [place]   left foot resection of achilles tendon spur  05/06/2007   [place]   MALONEY DILATION N/A 07/18/2018   Procedure: Elease Hashimoto DILATION;  Surgeon: Corbin Ade, MD;  Location: [facility] ENDO SUITE;  Service: Endoscopy;  Laterality: N/A;   Open stasis ulcer  [address], [place]   PARTIAL HYSTERECTOMY  1980   [place]terus only, [place]   SINUS EXPLORATION  1988   [place]   Skin grafts  2004   Brown recluse spider bite-lateral right ankle, open stasis ulcer-medial right ankle [facility]([place])   TOTAL KNEE ARTHROPLASTY Right 2007   [place]   TOTAL KNEE ARTHROPLASTY Left 2010   [place]   venous reflux  01/07/2015   [facility], Dr. Teofilo PodZagol      OB History   No obstetric history on file.     Family History  Problem Relation Age of Onset   Urinary tract infection Mother 1890        died from UTI/infection   Leukemia Father [id number]   Hypercholesterolemia Other    Heart Problems Paternal Grandmother    Asthma Son    Leukemia Daughter    Endometrial cancer Daughter    Colon cancer Neg Hx     Social History   Tobacco Use   Smoking status: Never Smoker   Smokeless tobacco: Never Used  Substance Use Topics   Alcohol use: Never   Drug use: Never    Home Medications Prior to Admission medications   Medication Sig Start Date End Date Taking? Authorizing Provider  acetaminophen (TYLENOL) 650 MG CR tablet Take 1,300 mg by mouth every 8 (eight) hours as needed for pain.     [provider]  Alirocumab (PRALUENT) 150 MG/ML SOAJ Inject into the skin every 14 (fourteen) days.    [provider]  Ascorbic Acid (VITAMIN C) 1000 MG tablet Take 1,000 mg by mouth daily.    [provider]  aspirin EC 81 MG tablet Take 81 mg by mouth daily.    [provider]  CALCIUM LACTATE PO Take 1-2 tablets by mouth See admin instructions. 2 tablets in the AM and 1 in the PM     [provider]  cephALEXin (KEFLEX) 500 MG capsule Take 1 capsule (500 mg total) by mouth 4 (four) times daily for 7 days. 07/15/20 07/22/20  Farrel GordonPatel, Ryiah Bellissimo, PA-C  Cholecalciferol (VITAMIN D3) 5000 units TABS Take 10,000 Units by mouth daily.     [provider]  clotrimazole-betamethasone (LOTRISONE) cream Apply 1 application topically 2 (two) times daily.    [provider]  ezetimibe (ZETIA) 10 MG tablet Take 10 mg by mouth daily.    [provider]  famotidine (PEPCID) 20 MG tablet Take 1 tablet (20 mg total) by mouth at bedtime. 05/17/20   Gelene MinkBoone, Anna W, NP  furosemide (LASIX) 40 MG tablet Take 40 mg by mouth daily as needed for fluid.     [provider]  guaiFENesin (MUCINEX) 600 MG 12 hr tablet Take 600 mg by mouth 2 (two) times daily as needed for cough.     [provider]  meclizine (ANTIVERT) 25 MG tablet Take  25 mg by mouth 2 (two) times daily as needed for dizziness.     [provider]  meloxicam (MOBIC) 15 MG tablet Take 15 mg by mouth as needed for pain.    [provider]  Multiple Vitamins-Minerals (AIRBORNE PO) Take by mouth as needed.    [provider]  Multiple Vitamins-Minerals (MULTIVITAL) tablet Take 3 tablets by mouth daily.    [provider]  Multiple Vitamins-Minerals (PRESERVISION AREDS 2 PO) Take by mouth 2 (two) times daily.    [provider]  Omega-3 Fatty Acids (OMEGA 3 PO) Take  1 tablet by mouth 2 (two) times daily.    [provider]  omeprazole (PRILOSEC) 40 MG capsule TAKE 1 CAPSULE BY MOUTH EVERY DAY 02/20/20   Gelene Mink, NP  Probiotic Product (PROBIOTIC PO) Take 1 capsule by mouth daily.    [provider]  Pseudoephedrine HCl (WAL-PHED PO) Take 1 tablet by mouth daily as needed.    [provider]  UNABLE TO FIND 3 tablets daily. Med Name: Catalyn multivitamin multi-mineral    [provider]  UNABLE TO FIND Place 1 drop into both eyes as needed. Med Name: Moxeza/Moxifloxacin 0.05% eye drops 3 mL.    [provider]    Allergies    Morphine, Prednisone, Doxycycline monohydrate, Other, Adhesive [tape], Benzalkonium, Cefdinir, Cephalexin, Codeine, Latex, Methylprednisolone, Propoxyphene, Rifampin, and Warfarin sodium  Review of Systems   Review of Systems  Constitutional: Negative for chills, diaphoresis, fatigue and fever.  HENT: Negative for congestion, sore throat and trouble swallowing.   Eyes: Negative for pain and visual disturbance.  Respiratory: Negative for cough, shortness of breath and wheezing.   Cardiovascular: Positive for leg swelling. Negative for chest pain and palpitations.  Gastrointestinal: Negative for abdominal distention, abdominal pain, diarrhea, nausea and vomiting.  Genitourinary: Negative for difficulty urinating.  Musculoskeletal: Positive for  arthralgias. Negative for back pain, neck pain and neck stiffness.  Skin: Negative for pallor.  Neurological: Negative for dizziness, speech difficulty, weakness and headaches.  Psychiatric/Behavioral: Negative for confusion.    Physical Exam Updated Vital Signs BP 119/67 (BP Location: Right Arm)    Pulse 88    Temp 97.8 F (36.6 C) (Oral)    Resp 20    Ht  (1.651 m)    Wt 120.2 kg    SpO2 98%    BMI 44.10 kg/m   Physical Exam Constitutional:      General: She is not in acute distress.    Appearance: Normal appearance. She is not ill-appearing, toxic-appearing or diaphoretic.  HENT:     Mouth/Throat:     Mouth: Mucous membranes are moist.     Pharynx: Oropharynx is clear.  Eyes:     General: No scleral icterus.    Extraocular Movements: Extraocular movements intact.     Pupils: Pupils are equal, round, and reactive to light.  Cardiovascular:     Rate and Rhythm: Normal rate and regular rhythm.     Pulses: Normal pulses.     Heart sounds: Normal heart sounds.  Pulmonary:     Effort: Pulmonary effort is normal. No respiratory distress.     Breath sounds: Normal breath sounds. No stridor. No wheezing, rhonchi or rales.  Chest:     Chest wall: No tenderness.  Abdominal:     General: Abdomen is flat. There is no distension.     Palpations: Abdomen is soft.     Tenderness: There is no abdominal tenderness. There is no guarding or rebound.  Musculoskeletal:        General: No swelling. Normal range of motion.     Cervical back: Normal range of motion and neck supple. No rigidity.     Right lower leg: Swelling and tenderness present. 2+ Pitting Edema present.     Left lower leg: Normal. No edema.       Legs:     Comments: Tenderness in areas depicted above.  See picture.  Erythema and warmth noted throughout. No true abscess palpated.  No fluctuance.  Does not extend to dorsal calf.  Calf is tender to palpation.  Patient is able to range leg in all directions with normal  strength.  Normal sensation.  PT pulse 2+ and equal.  Foot is warm with normal cap refill.  Skin:    General: Skin is warm and dry.     Capillary Refill: Capillary refill takes less than 2 seconds.     Coloration: Skin is not pale.  Neurological:     General: No focal deficit present.     Mental Status: She is alert and oriented to person, place, and time.  Psychiatric:        Mood and Affect: Mood normal.        Behavior: Behavior normal.         ED Results / Procedures / Treatments   Labs (all labs ordered are listed, but only abnormal results are displayed) Labs Reviewed  COMPREHENSIVE METABOLIC PANEL - Abnormal; Notable for the following components:      Result Value   Glucose, Bld 101 (*)    Albumin 3.4 (*)    All other components within normal limits  CBC WITH DIFFERENTIAL/PLATELET    EKG None  Radiology DG Tibia/Fibula Right  Result Date: 07/15/2020 CLINICAL DATA:  Leg pain and swelling EXAM: RIGHT TIBIA AND FIBULA - 2 VIEW COMPARISON:  None. FINDINGS: Right knee replacement is noted. No acute fracture or dislocation is noted. Calcaneal spurs are seen. New diffuse soft tissue calcifications are noted. No other focal abnormality is seen. IMPRESSION: No acute bony abnormality noted. Electronically Signed   By: Alcide Clever M.D.   On: 07/15/2020 14:29   US Venous Img Lower Right (DVT Study)  Result Date: 07/15/2020 CLINICAL DATA:  Lower extremity pain and edema EXAM: RIGHT LOWER EXTREMITY VENOUS DUPLEX ULTRASOUND TECHNIQUE: Gray-scale sonography with graded compression, as well as color Doppler and duplex ultrasound were performed to evaluate the right lower extremity deep venous system from the level of the common femoral vein and including the common femoral, femoral, profunda femoral, popliteal and calf veins including the posterior tibial, peroneal and gastrocnemius veins when visible. The superficial great saphenous vein was also interrogated. Spectral Doppler was  utilized to evaluate flow at rest and with distal augmentation maneuvers in the common femoral, femoral and popliteal veins. COMPARISON:  None. FINDINGS: Contralateral Common Femoral Vein: Respiratory phasicity is normal and symmetric with the symptomatic side. No evidence of thrombus. Normal compressibility. Common Femoral Vein: No evidence of thrombus. Normal compressibility, respiratory phasicity and response to augmentation. Saphenofemoral Junction: No evidence of thrombus. Normal compressibility and flow on color Doppler imaging. Profunda Femoral Vein: No evidence of thrombus. Normal compressibility and flow on color Doppler imaging. Femoral Vein: No evidence of thrombus. Normal compressibility, respiratory phasicity and response to augmentation. Popliteal Vein: No evidence of thrombus. Normal compressibility, respiratory phasicity and response to augmentation. Calf Veins: No evidence of thrombus. Normal compressibility and flow on color Doppler imaging. Superficial Great Saphenous Vein: No evidence of thrombus. Normal compressibility. Venous Reflux:  None. Other Findings:  None. IMPRESSION: No evidence of deep venous thrombosis in the right lower extremity. Left common femoral vein also patent. Electronically Signed   By: Bretta Bang III M.D.   On: 07/15/2020 14:26    Procedures Procedures (including critical care time)  Medications Ordered in ED Medications  ibuprofen (ADVIL) tablet 800 mg (800 mg Oral Given 07/15/20 1508)  cephALEXin (KEFLEX) capsule 500 mg (500 mg Oral Given 07/15/20 1608)    ED Course  I have reviewed the triage  vital signs and the nursing notes.  Pertinent labs & imaging results that were available during my care of the patient were reviewed by me and considered in my medical decision making (see chart for details).    MDM Rules/Calculators/A&P                         Traci Thompson is a 69 y.o. female with pertinent past medical history of chronic venous stasis,  DVT, fibromyalgia, hyperlipidemia, osteoarthritis that presents emergency department today for right lower leg swelling and pain.   Most likely to be cellulitis due to poor vasculature.  DVT also on the differential,Unlikely to be's SJS from Bactrim.   Work-up today reassuring, DVT study negative.  X-ray interpreted me without any abnormalities.  Most likely to be cellulitis, no abscess formation.  No streaking, fevers, tachycardia.  No leukocytosis.  Patient to be discharged with Keflex.  Will trial Keflex here in the ER since there is slight side effects to Keflex before sending her outpatient.  Did discuss that she can stop taking Bactrim for UTI since we are starting her on Keflex.  Patient to follow-up with PCP in 2 days, patient agreeable for this.  Strong return precautions given for worsening cellulitis.   Doubt need for further emergent work up at this time. I explained the diagnosis and have given explicit precautions to return to the ER including for any other new or worsening symptoms. The patient understands and accepts the medical plan as it's been dictated and I have answered their questions. Discharge instructions concerning home care and prescriptions have been given. The patient is STABLE and is discharged to home in good condition.  I discussed this case with my attending physician who cosigned this note including patient's presenting symptoms, physical exam, and planned diagnostics and interventions. Attending physician stated agreement with plan or made changes to plan which were implemented.    Final Clinical Impression(s) / ED Diagnoses Final diagnoses:  Cellulitis of right lower extremity    Rx / DC Orders ED Discharge Orders         Ordered    cephALEXin (KEFLEX) 500 MG capsule  4 times daily        07/15/20 1601           Farrel Gordon, PA-C 07/15/20 1620    Eber Hong, MD 07/20/20 [id number]

## 2020-07-15 NOTE — ED Triage Notes (Signed)
Pt has redness and swelling to right lower leg x 4 days; pt has a hx of DVT's

## 2020-07-15 NOTE — ED Notes (Signed)
Pt taken by radiology

## 2020-07-15 NOTE — Discharge Instructions (Addendum)
Take your Keflex as prescribed, as we talked about you do have some mild side effects from the Keflex, if you start having any severe reactions please stop taking this medication.  You do not any reactions from it here today.  I want you to follow-up with your primary care in the next 2 days.  If you start noticing any streaking, high fevers, you start forming an abscess please come back to the emergency department.  Please use the attached instructions.  You can stop taking your Bactrim, your Keflex will cover your UTI.  Please come back to the emergency department for any new worsening or new symptoms as we spoke about.  Take Tylenol as prescribed on bottle for pain. I hope you feel better!

## 2020-07-22 ENCOUNTER — Encounter (HOSPITAL_COMMUNITY): Payer: Self-pay | Admitting: Emergency Medicine

## 2020-07-22 ENCOUNTER — Inpatient Hospital Stay (HOSPITAL_COMMUNITY)
Admission: EM | Admit: 2020-07-22 | Discharge: 2020-07-26 | DRG: 603 | Disposition: A | Discharge status: Home / Self Care | Payer: Medicare Other | Attending: Internal Medicine | Admitting: Internal Medicine

## 2020-07-22 DIAGNOSIS — Z825 Family history of asthma and other chronic lower respiratory diseases: Secondary | ICD-10-CM

## 2020-07-22 DIAGNOSIS — Z8614 Personal history of Methicillin resistant Staphylococcus aureus infection: Secondary | ICD-10-CM

## 2020-07-22 DIAGNOSIS — Z90711 Acquired absence of uterus with remaining cervical stump: Secondary | ICD-10-CM

## 2020-07-22 DIAGNOSIS — Z806 Family history of leukemia: Secondary | ICD-10-CM

## 2020-07-22 DIAGNOSIS — Z888 Allergy status to other drugs, medicaments and biological substances status: Secondary | ICD-10-CM

## 2020-07-22 DIAGNOSIS — Z8616 Personal history of COVID-19: Secondary | ICD-10-CM

## 2020-07-22 DIAGNOSIS — Z881 Allergy status to other antibiotic agents status: Secondary | ICD-10-CM

## 2020-07-22 DIAGNOSIS — E7800 Pure hypercholesterolemia, unspecified: Secondary | ICD-10-CM | POA: Diagnosis present

## 2020-07-22 DIAGNOSIS — Z8049 Family history of malignant neoplasm of other genital organs: Secondary | ICD-10-CM

## 2020-07-22 DIAGNOSIS — I878 Other specified disorders of veins: Secondary | ICD-10-CM | POA: Diagnosis present

## 2020-07-22 DIAGNOSIS — E785 Hyperlipidemia, unspecified: Secondary | ICD-10-CM | POA: Diagnosis present

## 2020-07-22 DIAGNOSIS — Z96653 Presence of artificial knee joint, bilateral: Secondary | ICD-10-CM | POA: Diagnosis present

## 2020-07-22 DIAGNOSIS — M1990 Unspecified osteoarthritis, unspecified site: Secondary | ICD-10-CM | POA: Diagnosis present

## 2020-07-22 DIAGNOSIS — Z20822 Contact with and (suspected) exposure to covid-19: Secondary | ICD-10-CM | POA: Diagnosis present

## 2020-07-22 DIAGNOSIS — K219 Gastro-esophageal reflux disease without esophagitis: Secondary | ICD-10-CM | POA: Diagnosis present

## 2020-07-22 DIAGNOSIS — L0390 Cellulitis, unspecified: Secondary | ICD-10-CM | POA: Diagnosis not present

## 2020-07-22 DIAGNOSIS — R1310 Dysphagia, unspecified: Secondary | ICD-10-CM | POA: Diagnosis present

## 2020-07-22 DIAGNOSIS — Z86718 Personal history of other venous thrombosis and embolism: Secondary | ICD-10-CM

## 2020-07-22 DIAGNOSIS — L03115 Cellulitis of right lower limb: Principal | ICD-10-CM | POA: Diagnosis present

## 2020-07-22 DIAGNOSIS — Z791 Long term (current) use of non-steroidal anti-inflammatories (NSAID): Secondary | ICD-10-CM

## 2020-07-22 DIAGNOSIS — Z7982 Long term (current) use of aspirin: Secondary | ICD-10-CM

## 2020-07-22 DIAGNOSIS — I2510 Atherosclerotic heart disease of native coronary artery without angina pectoris: Secondary | ICD-10-CM | POA: Diagnosis present

## 2020-07-22 DIAGNOSIS — Z885 Allergy status to narcotic agent status: Secondary | ICD-10-CM

## 2020-07-22 DIAGNOSIS — Z79899 Other long term (current) drug therapy: Secondary | ICD-10-CM

## 2020-07-22 DIAGNOSIS — M797 Fibromyalgia: Secondary | ICD-10-CM | POA: Diagnosis present

## 2020-07-22 LAB — COMPREHENSIVE METABOLIC PANEL
ALT: 25 U/L (ref 0–44)
AST: 27 U/L (ref 15–41)
Albumin: 3.5 g/dL (ref 3.5–5.0)
Alkaline Phosphatase: 69 U/L (ref 38–126)
Anion gap: 11 (ref 5–15)
BUN: 12 mg/dL (ref 8–23)
CO2: 28 mmol/L (ref 22–32)
Calcium: 9.4 mg/dL (ref 8.9–10.3)
Chloride: 100 mmol/L (ref 98–111)
Creatinine, Ser: 0.62 mg/dL (ref 0.44–1.00)
GFR calc Af Amer: >60 mL/min (ref >60)
GFR calc non Af Amer: >60 mL/min (ref >60)
Glucose, Bld: 106 mg/dL — ABNORMAL HIGH (ref 70–99)
Potassium: 4.1 mmol/L (ref 3.5–5.1)
Sodium: 139 mmol/L (ref 135–145)
Total Bilirubin: 0.6 mg/dL (ref 0.3–1.2)
Total Protein: 7.2 g/dL (ref 6.5–8.1)

## 2020-07-22 LAB — CBC WITH DIFFERENTIAL/PLATELET
Abs Immature Granulocytes: 0.03 10*3/uL (ref 0.00–0.07)
Basophils Absolute: 0 10*3/uL (ref 0.0–0.1)
Basophils Relative: 0 %
Eosinophils Absolute: 0.1 10*3/uL (ref 0.0–0.5)
Eosinophils Relative: 2 %
HCT: 39.7 % (ref 36.0–46.0)
Hemoglobin: 12.5 g/dL (ref 12.0–15.0)
Immature Granulocytes: 1 %
Lymphocytes Relative: 35 %
Lymphs Abs: 2.2 10*3/uL (ref 0.7–4.0)
MCH: 28 pg (ref 26.0–34.0)
MCHC: 31.5 g/dL (ref 30.0–36.0)
MCV: 89 fL (ref 80.0–100.0)
Monocytes Absolute: 0.4 10*3/uL (ref 0.1–1.0)
Monocytes Relative: 6 %
Neutro Abs: 3.6 10*3/uL (ref 1.7–7.7)
Neutrophils Relative %: 56 %
Platelets: 365 10*3/uL (ref 150–400)
RBC: 4.46 MIL/uL (ref 3.87–5.11)
RDW: 13.5 % (ref 11.5–15.5)
WBC: 6.4 10*3/uL (ref 4.0–10.5)
nRBC: 0 % (ref 0.0–0.2)

## 2020-07-22 NOTE — ED Triage Notes (Addendum)
Pt arrives to ED with c/o of right leg swelling and pain since 9/21. Pt was placed on bactrim  On 9/21 and was no better so just was seen at [facility] on 9/27. Pt has hx of wound graft on right ankle in 2004. Pt was placed on clindamycin & Keflex  on 9/27 with no improvement pt now has swelling in right leg below knee which appears worse.  Pt did also have a DVT at [facility] which was negative.

## 2020-07-23 ENCOUNTER — Emergency Department (HOSPITAL_COMMUNITY): Payer: Medicare Other

## 2020-07-23 DIAGNOSIS — K219 Gastro-esophageal reflux disease without esophagitis: Secondary | ICD-10-CM | POA: Diagnosis present

## 2020-07-23 DIAGNOSIS — I878 Other specified disorders of veins: Secondary | ICD-10-CM | POA: Diagnosis present

## 2020-07-23 DIAGNOSIS — L0390 Cellulitis, unspecified: Secondary | ICD-10-CM | POA: Diagnosis present

## 2020-07-23 DIAGNOSIS — Z806 Family history of leukemia: Secondary | ICD-10-CM | POA: Diagnosis not present

## 2020-07-23 DIAGNOSIS — Z20822 Contact with and (suspected) exposure to covid-19: Secondary | ICD-10-CM | POA: Diagnosis present

## 2020-07-23 DIAGNOSIS — Z79899 Other long term (current) drug therapy: Secondary | ICD-10-CM | POA: Diagnosis not present

## 2020-07-23 DIAGNOSIS — I2510 Atherosclerotic heart disease of native coronary artery without angina pectoris: Secondary | ICD-10-CM | POA: Diagnosis present

## 2020-07-23 DIAGNOSIS — Z8049 Family history of malignant neoplasm of other genital organs: Secondary | ICD-10-CM | POA: Diagnosis not present

## 2020-07-23 DIAGNOSIS — Z888 Allergy status to other drugs, medicaments and biological substances status: Secondary | ICD-10-CM | POA: Diagnosis not present

## 2020-07-23 DIAGNOSIS — M1990 Unspecified osteoarthritis, unspecified site: Secondary | ICD-10-CM | POA: Diagnosis present

## 2020-07-23 DIAGNOSIS — Z885 Allergy status to narcotic agent status: Secondary | ICD-10-CM | POA: Diagnosis not present

## 2020-07-23 DIAGNOSIS — E785 Hyperlipidemia, unspecified: Secondary | ICD-10-CM | POA: Diagnosis present

## 2020-07-23 DIAGNOSIS — Z7982 Long term (current) use of aspirin: Secondary | ICD-10-CM | POA: Diagnosis not present

## 2020-07-23 DIAGNOSIS — Z90711 Acquired absence of uterus with remaining cervical stump: Secondary | ICD-10-CM | POA: Diagnosis not present

## 2020-07-23 DIAGNOSIS — Z96653 Presence of artificial knee joint, bilateral: Secondary | ICD-10-CM | POA: Diagnosis present

## 2020-07-23 DIAGNOSIS — Z825 Family history of asthma and other chronic lower respiratory diseases: Secondary | ICD-10-CM | POA: Diagnosis not present

## 2020-07-23 DIAGNOSIS — Z791 Long term (current) use of non-steroidal anti-inflammatories (NSAID): Secondary | ICD-10-CM | POA: Diagnosis not present

## 2020-07-23 DIAGNOSIS — Z8616 Personal history of COVID-19: Secondary | ICD-10-CM | POA: Diagnosis not present

## 2020-07-23 DIAGNOSIS — M797 Fibromyalgia: Secondary | ICD-10-CM | POA: Diagnosis present

## 2020-07-23 DIAGNOSIS — Z86718 Personal history of other venous thrombosis and embolism: Secondary | ICD-10-CM | POA: Diagnosis not present

## 2020-07-23 DIAGNOSIS — L03115 Cellulitis of right lower limb: Secondary | ICD-10-CM | POA: Diagnosis present

## 2020-07-23 DIAGNOSIS — Z881 Allergy status to other antibiotic agents status: Secondary | ICD-10-CM | POA: Diagnosis not present

## 2020-07-23 DIAGNOSIS — E7800 Pure hypercholesterolemia, unspecified: Secondary | ICD-10-CM | POA: Diagnosis present

## 2020-07-23 DIAGNOSIS — R1310 Dysphagia, unspecified: Secondary | ICD-10-CM | POA: Diagnosis present

## 2020-07-23 DIAGNOSIS — Z8614 Personal history of Methicillin resistant Staphylococcus aureus infection: Secondary | ICD-10-CM | POA: Diagnosis not present

## 2020-07-23 LAB — HIV ANTIBODY (ROUTINE TESTING W REFLEX): HIV Screen 4th Generation wRfx: NONREACTIVE

## 2020-07-23 LAB — RESPIRATORY PANEL BY RT PCR (FLU A&B, COVID)
Influenza A by PCR: NEGATIVE
Influenza B by PCR: NEGATIVE
SARS Coronavirus 2 by RT PCR: NEGATIVE

## 2020-07-23 MED ORDER — EZETIMIBE 10 MG PO TABS
10.0000 mg | ORAL_TABLET | Freq: Every day | ORAL | Status: DC
  Administered 2020-07-24 – 2020-07-26 (×3): 10 mg via ORAL
  Filled 2020-07-23 (×4): qty 1

## 2020-07-23 MED ORDER — ACETAMINOPHEN 325 MG PO TABS: 650.0000 mg | ORAL_TABLET | Freq: Four times a day (QID) | ORAL | Status: DC | PRN

## 2020-07-23 MED ORDER — SODIUM CHLORIDE 0.9 % IV BOLUS
1000.0000 mL | Freq: Once | INTRAVENOUS | Status: AC
  Administered 2020-07-23: 1000 mL via INTRAVENOUS

## 2020-07-23 MED ORDER — VANCOMYCIN HCL 2000 MG/400ML IV SOLN
2000.0000 mg | Freq: Once | INTRAVENOUS | Status: AC
  Administered 2020-07-23: 2000 mg via INTRAVENOUS
  Filled 2020-07-23: qty 400

## 2020-07-23 MED ORDER — PANTOPRAZOLE SODIUM 40 MG PO TBEC
40.0000 mg | DELAYED_RELEASE_TABLET | Freq: Every day | ORAL | Status: DC
  Administered 2020-07-23 – 2020-07-26 (×4): 40 mg via ORAL
  Filled 2020-07-23 (×4): qty 1

## 2020-07-23 MED ORDER — ENOXAPARIN SODIUM 60 MG/0.6ML ~~LOC~~ SOLN
60.0000 mg | SUBCUTANEOUS | Status: DC
  Administered 2020-07-23 – 2020-07-25 (×3): 60 mg via SUBCUTANEOUS
  Filled 2020-07-23 (×4): qty 0.6

## 2020-07-23 MED ORDER — ONDANSETRON HCL 4 MG/2ML IJ SOLN: 4.0000 mg | Freq: Four times a day (QID) | INTRAMUSCULAR | Status: DC | PRN

## 2020-07-23 MED ORDER — SODIUM CHLORIDE 0.9 % IV SOLN: INTRAVENOUS | Status: AC

## 2020-07-23 MED ORDER — POLYETHYLENE GLYCOL 3350 17 G PO PACK
17.0000 g | PACK | Freq: Every day | ORAL | Status: DC | PRN
  Administered 2020-07-24: 17 g via ORAL
  Filled 2020-07-23: qty 1

## 2020-07-23 MED ORDER — VANCOMYCIN HCL IN DEXTROSE 1-5 GM/200ML-% IV SOLN
1000.0000 mg | Freq: Two times a day (BID) | INTRAVENOUS | Status: DC
  Administered 2020-07-23 – 2020-07-25 (×4): 1000 mg via INTRAVENOUS
  Filled 2020-07-23 (×5): qty 200

## 2020-07-23 MED ORDER — FENTANYL CITRATE (PF) 100 MCG/2ML IJ SOLN
50.0000 ug | Freq: Once | INTRAMUSCULAR | Status: AC
  Administered 2020-07-23: 50 ug via INTRAVENOUS
  Filled 2020-07-23: qty 2

## 2020-07-23 MED ORDER — IOHEXOL 300 MG/ML  SOLN
100.0000 mL | Freq: Once | INTRAMUSCULAR | Status: AC | PRN
  Administered 2020-07-23: 100 mL via INTRAVENOUS

## 2020-07-23 MED ORDER — ONDANSETRON HCL 4 MG PO TABS: 4.0000 mg | ORAL_TABLET | Freq: Four times a day (QID) | ORAL | Status: DC | PRN

## 2020-07-23 MED ORDER — ACETAMINOPHEN 650 MG RE SUPP: 650.0000 mg | Freq: Four times a day (QID) | RECTAL | Status: DC | PRN

## 2020-07-23 MED ORDER — HYDROCODONE-ACETAMINOPHEN 10-325 MG PO TABS
1.0000 | ORAL_TABLET | Freq: Three times a day (TID) | ORAL | Status: DC | PRN
  Administered 2020-07-23 – 2020-07-24 (×3): 1 via ORAL
  Filled 2020-07-23 (×3): qty 1

## 2020-07-23 MED ORDER — ASPIRIN EC 81 MG PO TBEC
81.0000 mg | DELAYED_RELEASE_TABLET | Freq: Every day | ORAL | Status: DC
  Administered 2020-07-23 – 2020-07-26 (×4): 81 mg via ORAL
  Filled 2020-07-23 (×4): qty 1

## 2020-07-23 MED ORDER — FAMOTIDINE 20 MG PO TABS
20.0000 mg | ORAL_TABLET | Freq: Every day | ORAL | Status: DC
  Administered 2020-07-23 – 2020-07-25 (×3): 20 mg via ORAL
  Filled 2020-07-23 (×3): qty 1

## 2020-07-23 NOTE — ED Notes (Signed)
Pt c/o L inner eye pressure; denies vision changes, denies pain; Dr. Pilar Plate notified

## 2020-07-23 NOTE — H&P (Signed)
Date: 07/23/2020               Patient Name:  Traci Thompson MRN: [id number]  DOB: October 31, 1950 Age / Sex: 69 y.o., female   PCP: Norval Gable Carlena Bjornstad, MD         Medical Service: Internal Medicine Teaching Service         Attending Physician: Dr. Gust Rung, DO    First Contact: Dr. Claudette Laws Pager: [phone]  Second Contact: Dr. Sande Brothers Pager: [phone]       After Hours (After 5p/  First Contact Pager: [phone]  weekends / holidays): Second Contact Pager: [phone]   Chief Complaint: Right leg swelling, redness and pain  History of Present Illness:   69 y.o. female with PMHx of COVID-07 October 2019, DVT (remote Hx, was on anticoagulation for limited time), fibromyalgia, HLD, osteoarthritis, restrictive lung disease, who presented with 2 weeks history of right leg pain, swelling and redness.she denies any trauma, injury or wound prior to her symptoms.  She was a started on Bactrim about 2 weeks ago for UTI.  She developed right leg swelling and redness 2 days after that and when she saw a provider in her PCP office, she was told to continue Bactrim.  Her right leg erythema and pain did not get better, and she went to [facility] emergency room on 9/27 and was started on Keflex for right leg cellulitis.  At that time, right leg ultrasound obtained and was negative for DVT. X ray of rt leg was also negative for acute bony abnormality. she followed up with her PCP in 2 days after that and her antibiotic switched to clindamycin given no improvement on Keflex.  She was told to come to ED if no improvement in few days.  She mentions that the swelling, redness and the pain has been the same, with no worsening or improvement.  She had one episode of fever 2 days ago that resolved.  Otherwise, denies any other associated symptoms.  No nausea vomiting, no change in her appetite.  Review of system is negative for abdominal pain, diarrhea, dysuria, new shortness of breath, chest pain.  Of note, she  had history of DVT many years ago around her pregnancy.  And she was prescribed anticoagulation for a limited time.    Meds:  Alirocumab, aspirin, calcium, famotidine, Lasix 40 as needed, meclizine, Prilosec.  Current Meds  Medication Sig  . acetaminophen (TYLENOL) 650 MG CR tablet Take 1,300 mg by mouth every 8 (eight) hours as needed for pain.   . Alirocumab (PRALUENT) 150 MG/ML SOAJ Inject into the skin every 14 (fourteen) days.  . Ascorbic Acid (VITAMIN C) 1000 MG tablet Take 1,000 mg by mouth daily.  Marland Kitchen aspirin EC 81 MG tablet Take 81 mg by mouth daily.  Marland Kitchen CALCIUM LACTATE PO Take 1-2 tablets by mouth See admin instructions. 2 tablets in the AM and 1 in the PM   . Cholecalciferol (VITAMIN D3) 5000 units TABS Take 10,000 Units by mouth daily.   . clindamycin (CLEOCIN) 300 MG capsule Take 300 mg by mouth 3 (three) times daily.  . clotrimazole-betamethasone (LOTRISONE) cream Apply 1 application topically 2 (two) times daily as needed (rash).   . ezetimibe (ZETIA) 10 MG tablet Take 10 mg by mouth daily.  . famotidine (PEPCID) 20 MG tablet Take 1 tablet (20 mg total) by mouth at bedtime.  . furosemide (LASIX) 40 MG tablet Take 40 mg by mouth daily as needed for fluid.   Marland Kitchen  guaiFENesin (MUCINEX) 600 MG 12 hr tablet Take 600 mg by mouth 2 (two) times daily as needed for cough.   Marland Kitchen HYDROcodone-acetaminophen (NORCO) 10-325 MG tablet Take 1 tablet by mouth every 6 (six) hours as needed for pain.  . meclizine (ANTIVERT) 25 MG tablet Take 25 mg by mouth 2 (two) times daily as needed for dizziness.   . meloxicam (MOBIC) 15 MG tablet Take 15 mg by mouth as needed for pain.  . Multiple Vitamins-Minerals (PRESERVISION AREDS 2 PO) Take 1 tablet by mouth 2 (two) times daily.   . Omega-3 Fatty Acids (OMEGA 3 PO) Take 1 tablet by mouth 2 (two) times daily.  Marland Kitchen omeprazole (PRILOSEC) 40 MG capsule TAKE 1 CAPSULE BY MOUTH EVERY DAY (Patient taking differently: Take 40 mg by mouth daily. )  . Probiotic Product  (PROBIOTIC PO) Take 1 capsule by mouth daily.  Marland Kitchen UNABLE TO FIND 3 tablets daily. Med Name: Catalyn multivitamin multi-mineral  . UNABLE TO FIND Place 1 drop into both eyes as needed. Med Name: Moxeza/Moxifloxacin 0.05% eye drops 3 mL.     Allergies: Allergies as of 07/22/2020 - Review Complete 07/22/2020  Allergen Reaction Noted  . Morphine Nausea Only, Rash, and Other (See Comments) 05/09/2018  . Prednisone Swelling 01/04/2019  . Doxycycline monohydrate  11/22/2019  . Other Nausea Only 11/22/2019  . Adhesive [tape] Itching and Rash 05/09/2018  . Benzalkonium Itching and Rash 05/09/2018  . Cefdinir Nausea Only and Other (See Comments) 05/09/2018  . Cephalexin Nausea Only, Rash, and Other (See Comments) 05/09/2018  . Codeine Nausea Only and Other (See Comments) 05/09/2018  . Latex Itching and Rash 05/09/2018  . Methylprednisolone Nausea Only and Other (See Comments) 05/09/2018  . Propoxyphene Nausea Only and Other (See Comments) 05/09/2018  . Rifampin Nausea Only and Other (See Comments) 05/09/2018  . Warfarin sodium Nausea Only and Other (See Comments) 05/09/2018   Past Medical History:  Diagnosis Date  . Chronic venous stasis   . Complication of anesthesia    has been difficult to put to sleep in the past  . DVT (deep venous thrombosis) (HCC)   . Fibromyalgia   . Hyperlipidemia   . Osteoarthritis   . Restrictive lung disease    Dr. Lorenza Evangelist    Family History: HLD in mother  Social History: Does not smoke, no alcohol use, no illicit drug use.  Review of Systems: A complete ROS was negative except as per HPI.  Physical Exam: Blood pressure 138/82, pulse 85, temperature 98.2 F (36.8 C), temperature source Oral, resp. rate 17, height 5\' 5"  (1.651 m), weight 122.5 kg, SpO2 95 %.   Constitutional: Well-developed and well-nourished. No acute distress.  HENT:  Head: Normocephalic and atraumatic.  Eyes: Conjunctivae are normal, EOM nl Cardiovascular:  RRR, nl S1S2,  Systolic murmur in rt upper sternal border (A area) no radiation to the neckr, mild rt LE swelling Respiratory: Effort normal and breath sounds normal. No respiratory distress.  GI: Soft. Bowel sounds are normal. No distension. There is no tenderness.  Neurological: Is alert and oriented x 3  MSK: Rt leg erythema and mild swelling, mildly warmth and tender to palpation, DP pulses Pulses are weak but present and symetric Psychiatric: Normal mood and affect. Behavior is normal. Judgment and thought content normal.         CBC Latest Ref Rng & Units 07/22/2020 07/15/2020 07/11/2018  WBC 4.0 - 10.5 K/uL 6.4 7.9 6.7  Hemoglobin 12.0 - 15.0 g/dL 07/13/2018 08.6 13.5  Hematocrit 36 - 46 % 39.7 37.5 40.7  Platelets 150 - 400 K/uL 365 264 245   BMP Latest Ref Rng & Units 07/22/2020 07/15/2020 11/22/2019  Glucose 70 - 99 mg/dL 409(W106(H) 119(J101(H) 92  BUN 8 - 23 mg/dL 12 15 19   Creatinine 0.44 - 1.00 mg/dL 4.780.62 2.950.68 6.210.80  BUN/Creat Ratio 12 - 28 - - 24  Sodium 135 - 145 mmol/L 139 138 140  Potassium 3.5 - 5.1 mmol/L 4.1 4.6 5.0  Chloride 98 - 111 mmol/L 100 101 102  CO2 22 - 32 mmol/L 28 26 26   Calcium 8.9 - 10.3 mg/dL 9.4 9.3 30.810.1    Assessment & Plan by Problem: Active Problems:   Cellulitis   69 y.o. female with PMHx of COVID-07 October 2019, DVT (remote Hx, was on anticoagulation for limited time), fibromyalgia, HLD, osteoarthritis, restrictive lung disease, who presented with rt leg pain, swelling and redness, that failed p.o. antibiotic therapy outpatient.  She is admitted for IV antibiotic.   Right leg cellulitis:  Non-purulent, mild (without systemic symptoms) Failed p.o. antibiotic therapy outpatient  Patient presented with 2 weeks history of leg pain and swelling in 1 episode of fever 2 days ago.  No history of ulceration or trauma recently.  She was seen in [facility] ED and in her PCP office twice for the symptoms.  She failed p.o. antibiotic treatment outpatient for cellulitis (she has  been treated with Keflex that then switched to clindamycin with no improvement).  And also xray 9/27 that did not show acute bony abnormality. Doppler ultrasound 9/27 was negative for DVT.   Today on arrival to ED, her vital signs are stable and she does not systemic sign of ingection. No leukocytosis and no fever.   She has erythema and swelling at medial wall right leg with tenderness to palpation.  CT scan of the leg ordered and and did not show acute finding.  Assessment: Unclear why she does has resistant cellulitis. CT scan of the leg did not show underlying abscess or osteomyelitis. Of note, she has had remote history of skin graft (after on healing wound due to spider bite on 2004), bilateral knee surgery and postsurgical cellulitis, and CT guided injection at right SI joint on February 2021.  She also has chronic venous stasis.  Otherwise, no recent procedure, trauma or injury at right leg. She has remote history of DVT.  She is not sure what was the cause of it but mentions that she was told to be on anticoagulation for limited time. Recent Doppler ultrasound that was obtained on prior ED visit on 9/27 was negative for DVT.  She does not have signs or symptoms of PE.  She has mild (without systemic symptoms), nonpurulent cellulitis.  But given prolonged symptoms and no improvement with p.o. antibiotic therapy including Keflex and clindamycin, will keep her on IV vancomycin.  -Continue IV Vancomycin  -Her right leg swelling is likely due to cellulitis and recent DVT ultrasound was negative.  If more concern arises regarding DVT, will consider repeating venous Doppler ultrasound -CBC daily -VTE ppx -Status post 1 L of normal saline in ED.  Will give NS 75 mL/h x 10-hour -Tylenol as needed for mild pain and Norco every 8 hours as needed for breakthrough pain   Past/chronic medical conditions:  COVID-19:  Patient and her wife had COVID-19 on last December. Have not received COVID  vaccine. States that, "it is because many people around them died because of the vaccine."  (  Familial?) hyper cholesterolemia -On PSK 9 inhibitor q. 2 weeks. Last injection was 9/29. Also on Zetia -Continue ASA  GERD: Dysphagia Patient follows with GI outpatient.  Most recent EGD 2019 showed mild erosive reflux esophagitis status post overeating dilation, small hiatal hernia and otherwise normal. Symptoms are stable per last GI evaluation.  She will follow-up outpatient.  -Continue home PPI and H2 blocker -Avoiding NSAIDs if possible -Can use as needed TUMS if worsening of symptoms  Dispo: Admit patient to Inpatient with expected length of stay greater than 2 midnights.  SignedChevis Pretty, MD 07/23/2020, 4:37 PM  Pager: [phone] After 5pm on weekdays and 1pm on weekends: On Call pager: [phone]

## 2020-07-23 NOTE — ED Provider Notes (Signed)
[facility]-EMERGENCY DEPT [facility] Emergency Department Provider Note MRN:  [id number]  Arrival date & time: 07/23/20     Chief Complaint   Leg Swelling and Wound Infection   History of Present Illness   Traci Thompson is a 69 y.o. year-old female with a history of DVT presenting to the ED with chief complaint of leg pain.  Patient is endorsing persistent leg pain, swelling, redness for nearly 2 weeks.  Was diagnosed with cellulitis and started on antibiotics.  Has been treated with 3 different antibiotics and there is no improvement, leg continues to worsen.  Pain is worse with palpation or movement, denies trauma, had fever a few days ago but none since.  No shortness of breath, no chest pain, no other complaints.  Review of Systems  A complete 10 system review of systems was obtained and all systems are negative except as noted in the HPI and PMH.   Patient's Health History    Past Medical History:  Diagnosis Date   Chronic venous stasis    Complication of anesthesia    has been difficult to put to sleep in the past   DVT (deep venous thrombosis) (HCC)    Fibromyalgia    Hyperlipidemia    Osteoarthritis    Restrictive lung disease    Dr. Lorenza Evangelist    Past Surgical History:  Procedure Laterality Date   ABI  01/04/2015   looking for PAD, [facility]. Dr. Teofilo Pod.    APPENDECTOMY  [address] [place]   Black widow spider bite  2003   [place]   Bone spur and suture abscess excision Right 2006   Right foot: [place]   Bone spur excision  2004   Right ankle, [place]   CARDIAC CATHETERIZATION  03/2018   [facility]: nonobstructive CAD   COLONOSCOPY  2014   Dr. Elder Cyphers: Propofol: Left sided diverticulosis.  10-year follow-up colonoscopy recommended   ESOPHAGOGASTRODUODENOSCOPY  2014   Dr. Elder Cyphers: Propofol.  Small hiatal hernia.  No obvious stricture or esophageal ring.  Empirically dilated with 54 bougie dilator.  Duodenal  biopsy with mild focal villous blunting but no evidence of celiac. neg for H.pylori as well.  Esophageal biopsy benign.   ESOPHAGOGASTRODUODENOSCOPY (EGD) WITH PROPOFOL N/A 07/18/2018   Procedure: ESOPHAGOGASTRODUODENOSCOPY (EGD) WITH PROPOFOL;  Surgeon: Corbin Ade, MD;  Location: [facility] ENDO SUITE;  Service: Endoscopy;  Laterality: N/A;  11:15am   Foot spur Left 2008   [facility] saphenous laser ablation  2005   [place]   great saphenous laser ablation Right 2012   [facility]   KNEE ARTHROSCOPY Right 1995   [place]   left foot resection of achilles tendon spur  05/06/2007   [place]   MALONEY DILATION N/A 07/18/2018   Procedure: Elease Hashimoto DILATION;  Surgeon: Corbin Ade, MD;  Location: [facility] ENDO SUITE;  Service: Endoscopy;  Laterality: N/A;   Open stasis ulcer  1982   [place], [place]   PARTIAL HYSTERECTOMY  1980   Uterus only, [place], [place]   SINUS EXPLORATION  1988   [place], [place]   Skin grafts  2004   Brown recluse spider bite-lateral right ankle, open stasis ulcer-medial right ankle [facility] [place])   TOTAL KNEE ARTHROPLASTY Right 2007   [place]   TOTAL KNEE ARTHROPLASTY Left 2010   [place]   venous reflux  01/07/2015   [facility], Dr. Teofilo Pod     Family  History  Problem Relation Age of Onset   Urinary tract infection Mother 26       died from UTI/infection   Leukemia Father 79   Hypercholesterolemia Other    Heart Problems Paternal Grandmother    Asthma Son    Leukemia Daughter    Endometrial cancer Daughter    Colon cancer Neg Hx     Social History   Socioeconomic History   Marital status: Married    Spouse name: Not on file   Number of children: 6   Years of education: Not on file   Highest education level: Master's degree (e.g., MA, MS, MEng, MEd, MSW, MBA)  Occupational History   Occupation: retired Scientist, research (physical sciences)  Tobacco Use   Smoking  status: Never Smoker   Smokeless tobacco: Never Used  Substance and Sexual Activity   Alcohol use: Never   Drug use: Never   Sexual activity: Not on file  Other Topics Concern   Not on file  Social History Narrative   Lives at home with husband   Caffeine 1 cup of coffee/day   Social Determinants of Health   Financial Resource Strain:    Difficulty of Paying Living Expenses: Not on file  Food Insecurity:    Worried About Programme researcher, broadcasting/film/video in the Last Year: Not on file   [employer] of Food in the Last Year: Not on file  Transportation Needs:    Lack of Transportation (Medical): Not on file   Lack of Transportation (Non-Medical): Not on file  Physical Activity:    Days of Exercise per Week: Not on file   Minutes of Exercise per Session: Not on file  Stress:    Feeling of Stress : Not on file  Social Connections:    Frequency of Communication with Friends and Family: Not on file   Frequency of Social Gatherings with Friends and Family: Not on file   Attends Religious Services: Not on file   Active Member of Clubs or Organizations: Not on file   Attends Banker Meetings: Not on file   Marital Status: Not on file  Intimate Partner Violence:    Fear of Current or Ex-Partner: Not on file   Emotionally Abused: Not on file   Physically Abused: Not on file   Sexually Abused: Not on file     Physical Exam   Vitals:   07/23/20 0900 07/23/20 0915  BP: (!) 116/57 (!) 123/50  Pulse: 73 77  Resp: 20 19  Temp:    SpO2: 94% 94%    CONSTITUTIONAL: Well-appearing, NAD NEURO:  Alert and oriented x 3, no focal deficits EYES:  eyes equal and reactive ENT/NECK:  no LAD, no JVD CARDIO: Regular rate, well-perfused, normal S1 and S2 PULM:  CTAB no wheezing or rhonchi GI/GU:  normal bowel sounds, non-distended, non-tender MSK/SPINE: Edema and erythema to the right lower extremity in the tib-fib region with tenderness to palpation SKIN:  no rash,  atraumatic PSYCH:  Appropriate speech and behavior  *Additional and/or pertinent findings included in MDM below  Diagnostic and Interventional Summary    EKG Interpretation  Date/Time:    Ventricular Rate:    PR Interval:    QRS Duration:   QT Interval:    QTC Calculation:   R Axis:     Text Interpretation:        Labs Reviewed  COMPREHENSIVE METABOLIC PANEL - Abnormal; Notable for the following components:      Result Value  Glucose, Bld 106 (*)    All other components within normal limits  RESPIRATORY PANEL BY RT PCR (FLU A&B, COVID)  CBC WITH DIFFERENTIAL/PLATELET    CT EXTREMITY LOWER RIGHT W CONTRAST  Final Result      Medications  vancomycin (VANCOREADY) IVPB 2000 mg/400 mL (has no administration in time range)  vancomycin (VANCOCIN) IVPB 1000 mg/200 mL premix (has no administration in time range)  fentaNYL (SUBLIMAZE) injection 50 mcg (has no administration in time range)  sodium chloride 0.9 % bolus 1,000 mL (0 mLs Intravenous Stopped 07/23/20 1024)  iohexol (OMNIPAQUE) 300 MG/ML solution 100 mL (100 mLs Intravenous Contrast Given 07/23/20 1005)     Procedures  /  Critical Care Procedures  ED Course and Medical Decision Making  I have reviewed the triage vital signs, the nursing notes, and pertinent available records from the EMR.  Listed above are laboratory and imaging tests that I personally ordered, reviewed, and interpreted and then considered in my medical decision making (see below for details).  Consistent with cellulitis failing outpatient therapy, has had a DVT ultrasound which is negative for DVT.  Unclear why patient is not improving despite multiple antibiotic courses, will CT to exclude underlying abscess or osteomyelitis.     CT is without fluid collection or osteomyelitis, evidence to support diagnosis of cellulitis, will admit to medicine.  Elmer Sow. Pilar Plate, MD [facility] Emergency Medicine [facility]  Health [email or web address]  Final Clinical Impressions(s) / ED Diagnoses     ICD-10-CM   1. Cellulitis of right lower extremity  L03.115     ED Discharge Orders    None       Discharge Instructions Discussed with and Provided to Patient:   Discharge Instructions   None       Sabas Sous, MD 07/23/20 [phone]

## 2020-07-23 NOTE — Progress Notes (Signed)
Pharmacy Antibiotic Note  Traci Thompson is a 69 y.o. female admitted on 07/22/2020 with cellulitis, failed clinda + keflex PTA.  Pharmacy has been consulted for vancomycin dosing.  Plan: Vancomycin 2000 mg IV x 1, then 1000 mg IV every 12 hours Monitor renal function, clinical progression and LOT Vancomycin trough at steady state     Temp (24hrs), Avg:98.1 F (36.7 C), Min:98 F (36.7 C), Max:98.2 F (36.8 C)  Recent Labs  Lab 07/22/20 1553  WBC 6.4  CREATININE 0.62    Estimated Creatinine Clearance: 85 mL/min (by C-G formula based on SCr of 0.62 mg/dL).    Allergies  Allergen Reactions  . Morphine Nausea Only, Rash and Other (See Comments)    Dizzy, headache, rash  . Prednisone Swelling    Swollen face with flushing  . Doxycycline Monohydrate     Swollen face, head pressure  . Other Nausea Only    Darvocet- Dizziness  . Adhesive [Tape] Itching and Rash  . Benzalkonium Itching and Rash  . Cefdinir Nausea Only and Other (See Comments)    Dizzy  . Cephalexin Nausea Only, Rash and Other (See Comments)    Dizzy  . Codeine Nausea Only and Other (See Comments)    dizzy  . Latex Itching and Rash  . Methylprednisolone Nausea Only and Other (See Comments)    dizzy  . Propoxyphene Nausea Only and Other (See Comments)    dizzy  . Rifampin Nausea Only and Other (See Comments)    Dizzy, flushing  . Warfarin Sodium Nausea Only and Other (See Comments)    dizzy    Daylene Posey, PharmD Clinical Pharmacist ED Pharmacist Phone # [phone] 07/23/2020 8:36 AM

## 2020-07-23 NOTE — ED Notes (Signed)
Transport requested

## 2020-07-23 NOTE — Hospital Course (Addendum)
Hospital Course: Traci Thompson presented to the [facility] on 07/22/20 with several days of progressive pain, edema, erythema, and warmth of her R lower leg. She had previously been seen by her PCP and in the [facility] ED and had failed outpatient oral antibiotic therapy (Keflex and Clindamycin) for cellulitis. Prior Doppler was negative for DVT and Xray neg for osseous involvement. CT in [facility] consistent with cellulitis without concern for osseous involvement. She was started on IV Vancomycin and saw some improvement in symptoms within 24 hours. She was transitioned to PO Linezolid (due to her failure of Keflex/Clinda and her allergy to doxycycline) in preparation for discharge. She tolerated her first linezolid dose well and was discharged with instructions to complete 7 day course.    Follow-up recs: Health Maintenance: Discussed safety and efficacy of COVID vaccine and answered patient questions. She declined vaccination in hospital as she has some lingering concerns related to stories she has heard from friends and neighbor. - Continue to offer vaccination. Answer questions as patient has them.

## 2020-07-23 NOTE — ED Notes (Signed)
Pt given food and beverage per Dr. Bero 

## 2020-07-24 DIAGNOSIS — Z7982 Long term (current) use of aspirin: Secondary | ICD-10-CM

## 2020-07-24 DIAGNOSIS — L03115 Cellulitis of right lower limb: Principal | ICD-10-CM

## 2020-07-24 DIAGNOSIS — Z8614 Personal history of Methicillin resistant Staphylococcus aureus infection: Secondary | ICD-10-CM

## 2020-07-24 DIAGNOSIS — E785 Hyperlipidemia, unspecified: Secondary | ICD-10-CM

## 2020-07-24 DIAGNOSIS — Z8616 Personal history of COVID-19: Secondary | ICD-10-CM

## 2020-07-24 DIAGNOSIS — K219 Gastro-esophageal reflux disease without esophagitis: Secondary | ICD-10-CM

## 2020-07-24 LAB — CBC
HCT: 34.2 % — ABNORMAL LOW (ref 36.0–46.0)
Hemoglobin: 11.1 g/dL — ABNORMAL LOW (ref 12.0–15.0)
MCH: 28.6 pg (ref 26.0–34.0)
MCHC: 32.5 g/dL (ref 30.0–36.0)
MCV: 88.1 fL (ref 80.0–100.0)
Platelets: 284 10*3/uL (ref 150–400)
RBC: 3.88 MIL/uL (ref 3.87–5.11)
RDW: 13.7 % (ref 11.5–15.5)
WBC: 4.9 10*3/uL (ref 4.0–10.5)
nRBC: 0 % (ref 0.0–0.2)

## 2020-07-24 LAB — BASIC METABOLIC PANEL
Anion gap: 8 (ref 5–15)
BUN: 12 mg/dL (ref 8–23)
CO2: 23 mmol/L (ref 22–32)
Calcium: 8.6 mg/dL — ABNORMAL LOW (ref 8.9–10.3)
Chloride: 107 mmol/L (ref 98–111)
Creatinine, Ser: 0.6 mg/dL (ref 0.44–1.00)
GFR calc non Af Amer: >60 mL/min (ref >60)
Glucose, Bld: 104 mg/dL — ABNORMAL HIGH (ref 70–99)
Potassium: 3.9 mmol/L (ref 3.5–5.1)
Sodium: 138 mmol/L (ref 135–145)

## 2020-07-24 MED ORDER — HYDROCODONE-ACETAMINOPHEN 10-325 MG PO TABS
1.0000 | ORAL_TABLET | Freq: Three times a day (TID) | ORAL | Status: DC | PRN
  Administered 2020-07-24 – 2020-07-26 (×3): 1 via ORAL
  Filled 2020-07-24 (×3): qty 1

## 2020-07-24 MED ORDER — ACETAMINOPHEN 325 MG PO TABS
650.0000 mg | ORAL_TABLET | Freq: Four times a day (QID) | ORAL | Status: DC
  Administered 2020-07-24 – 2020-07-26 (×6): 650 mg via ORAL
  Filled 2020-07-24 (×7): qty 2

## 2020-07-24 MED ORDER — FLUTICASONE PROPIONATE 50 MCG/ACT NA SUSP
2.0000 | Freq: Every day | NASAL | Status: DC
  Administered 2020-07-24 – 2020-07-26 (×3): 2 via NASAL
  Filled 2020-07-24: qty 16

## 2020-07-24 NOTE — Progress Notes (Signed)
Subjective:  Traci Thompson reports that her leg pain is unchanged since yesterday. She was controlling her pain with Tylenol at home and this "took the edge off." Has been getting Norco for pain control while in the hospital. Willing to try scheduling Tylenol and using Norco for breakthrough pain. We discussed the reasons for switching to IV abx. She is concerned that she has not seen improvement with 24 hours of Vancomycin. We discussed Vanc being bacteriostatic vs bacteriocidal and that we expect her to start seeing improvement soon.  She states uses Flonase at home and is requesting some today. She also asks if we are able to provide flax seed to help "keep her regular." Willing to try other fiber supplements that we have on formulary.  She has not received a COVID vaccine as she has some concerns related to experiences her friends and neighbors have had post-vaccination. We discussed these and answered questions regarding vaccine safety and efficacy.   Objective:  Vital signs in last 24 hours: Vitals:   07/23/20 1916 07/24/20 0056 07/24/20 0426 07/24/20 0756  BP: (!) 123/58 122/62 102/83 (!) 138/108  Pulse: 84 75 68 (!) 40  Resp: 18 17 18 18   Temp: 97.9 F (36.6 C) 98.3 F (36.8 C) 97.9 F (36.6 C) 98.4 F (36.9 C)  TempSrc: Oral Oral Oral Oral  SpO2: 98% 96% 96% 95%  Weight:      Height:       Weight change:   Intake/Output Summary (Last 24 hours) at 07/24/2020 1502 Last data filed at 07/24/2020 1346 Gross per 24 hour  Intake 1435.62 ml  Output --  Net 1435.62 ml   Physical Exam Vitals and nursing note reviewed.  Constitutional:      General: She is not in acute distress.    Appearance: Normal appearance.  Pulmonary:     Effort: Pulmonary effort is normal.     Breath sounds: Normal breath sounds.  Musculoskeletal:     Comments: Right lower extremity with erythema, warmth, and edema covering distal portion of lower leg. Area of pronounced edema and exquisite tenderness  without erythema noted proximally. Photo in chart. Outline of confluent erythema marked for future comparison.   Feet:     Comments: Post tibialis 1+ bilaterally. Dorsalis pedis 1+ on Left foot, barely perceptible on R.  Neurological:     Mental Status: She is alert.     Assessment/Plan:  Active Problems:   Cellulitis   Right leg cellulitis:  Patient presented with 2 weeks history of leg pain and swelling in 1 episode of fever 2 days ago.  No history of ulceration or trauma recently. Failed PO treatment with Bactrim, Keflex, and clindamycin. No fevers or leukocytosis in hospital.  No blood cx obtained as patient had been on several days of abx. Xray 9/27 with no acute bony abnormality. Doppler 9/27 negative for DVT. CT with circumferential edema c/w cellulitis, no organized fluid collection or soft tissue gas. Signs and symptoms consistent with cellulitis from resistant organisms.  -Continue IV Vancomycin  -Her right leg swelling is likely due to cellulitis and recent DVT ultrasound was negative.  If more concern arises regarding DVT, will consider repeating venous Doppler ultrasound -CBC daily -VTE ppx with Lovenox - Scheduling Tylenol 650 q6 - Norco for breakthrough pain  HLD: - On PSK9 inhibitor q2 weeks. Last injection 9/29. Also on Zetia. - ASA 81mg  daily  GERD: Dysphagia: Patient follows with GI outpatient.  Most recent EGD 2019 showed mild erosive reflux  esophagitis status post overeating dilation, small hiatal hernia and otherwise normal. Symptoms are stable per last GI evaluation.  She will follow-up outpatient. -Continue home PPI and H2 blocker -Avoiding NSAIDs if possible -Can use as needed TUMS if worsening of symptoms  COVID-19: Patient and her husband both had COVID in December. Discussed benefits of vaccine today but patient remains hesitant given concerns regarding friends and neighbors.  - Remain open to patient's questions.      LOS: 1 day   Dorothyann Gibbs, Medical Student 07/24/2020, 3:02 PM

## 2020-07-24 NOTE — Progress Notes (Signed)
New Admission Note:  Arrival Method: Via stretcher from ED to 64m13 Mental Orientation: Alert & Oriented x4 Telemetry: CCMD verified. Box-13 Assessment: Completed Skin: Refer to flowsheet IV: Left AC Pain: 0/10 Tubes: Safety Measures: Safety Fall Prevention Plan discussed with patient. Admission: Completed [facility] Orientation: Patient has been orientated to the room, unit and the staff. Family: Husband at the bedside.   Orders have been reviewed and are being implemented. Will continue to monitor the patient. Call light has been placed within reach and bed alarm has been activated.   Aram Candela, RN  Phone Number: [phone]

## 2020-07-24 NOTE — Plan of Care (Signed)
  Problem: Education: Goal: Knowledge of General Education information will improve Description Including pain rating scale, medication(s)/side effects and non-pharmacologic comfort measures Outcome: Progressing   

## 2020-07-25 LAB — BASIC METABOLIC PANEL
Anion gap: 10 (ref 5–15)
BUN: 9 mg/dL (ref 8–23)
CO2: 25 mmol/L (ref 22–32)
Calcium: 8.7 mg/dL — ABNORMAL LOW (ref 8.9–10.3)
Chloride: 105 mmol/L (ref 98–111)
Creatinine, Ser: 0.66 mg/dL (ref 0.44–1.00)
GFR calc non Af Amer: >60 mL/min (ref >60)
Glucose, Bld: 97 mg/dL (ref 70–99)
Potassium: 4 mmol/L (ref 3.5–5.1)
Sodium: 140 mmol/L (ref 135–145)

## 2020-07-25 LAB — CBC WITH DIFFERENTIAL/PLATELET
Abs Immature Granulocytes: 0.01 10*3/uL (ref 0.00–0.07)
Basophils Absolute: 0 10*3/uL (ref 0.0–0.1)
Basophils Relative: 1 %
Eosinophils Absolute: 0.1 10*3/uL (ref 0.0–0.5)
Eosinophils Relative: 2 %
HCT: 34.3 % — ABNORMAL LOW (ref 36.0–46.0)
Hemoglobin: 10.9 g/dL — ABNORMAL LOW (ref 12.0–15.0)
Immature Granulocytes: 0 %
Lymphocytes Relative: 44 %
Lymphs Abs: 2.5 10*3/uL (ref 0.7–4.0)
MCH: 27.8 pg (ref 26.0–34.0)
MCHC: 31.8 g/dL (ref 30.0–36.0)
MCV: 87.5 fL (ref 80.0–100.0)
Monocytes Absolute: 0.5 10*3/uL (ref 0.1–1.0)
Monocytes Relative: 10 %
Neutro Abs: 2.4 10*3/uL (ref 1.7–7.7)
Neutrophils Relative %: 43 %
Platelets: 265 10*3/uL (ref 150–400)
RBC: 3.92 MIL/uL (ref 3.87–5.11)
RDW: 13.7 % (ref 11.5–15.5)
WBC: 5.6 10*3/uL (ref 4.0–10.5)
nRBC: 0 % (ref 0.0–0.2)

## 2020-07-25 MED ORDER — LINEZOLID 600 MG PO TABS
600.0000 mg | ORAL_TABLET | Freq: Two times a day (BID) | ORAL | Status: DC
  Administered 2020-07-25 – 2020-07-26 (×2): 600 mg via ORAL
  Filled 2020-07-25 (×3): qty 1

## 2020-07-25 NOTE — Progress Notes (Signed)
Subjective:  Ms. Traci Thompson states that she "is seeing some improvement" this morning. She is still having pain, but believes that her leg appears improved compared to yesterday. She denies any fever or chills overnight. She received some Miralax last night and states that "it worked alright." Her husband is bringing her a pumpkin and flax seed smoothie as she says flax is her usual bowel regimen. She is hopeful for discharge soon as her college homecoming is this weekend. We discussed the possibility of transitioning her to PO antibiotics soon. Discussed the need for MRSA coverage and that given failure of Bactrim and Clinda and her doxycycline allergy, Linezolid may be best choice. After discussing risks and benefits, she is amenable to this.  She is also about measures she can take to prevent further complications. Counseled on use of compression stockings. She has some at home. 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 Cardiovascular:     Pulses:          Dorsalis pedis pulses are 1+ on the right side and 1+ on the left side.       Posterior tibial pulses are 1+ on the right side and 1+ on the left side.  Skin:    Comments: Right lower extremity with erythema, warmth, and edema all improved from yesterday. Photo in chart. Area of confluent erythema withdrawing from outline marked on skin yesterday.   Neurological:      Mental Status: She is alert.     Assessment/Plan:  Active Problems:   Cellulitis  Right leg cellulitis: Patient presented with 2 weeks history of leg pain and swellingin 1 episode of fever 2 days ago.No history of ulceration ortrauma recently. FailedPO treatment with Bactrim, Keflex, and clindamycin. No fevers or leukocytosis in hospital.  No blood cx obtained as patient had been on several days of abx. Xray 9/27 with no acute bony abnormality.Doppler 9/27 negative for DVT. CT with circumferential edema c/w cellulitis, no organized fluid collection or soft tissue gas. Signs and symptoms consistent with cellulitis from resistant organisms. She has been improving on IV Vancomycin and is otherwise stable. Area of confluent erythema reduced from previous and pain is better controlled today. Hopeful that she will be able to transition to PO abx and discharge tomorrow. Antibiotic choice should ensure MRSA coverage given her history of MRSA bacteremia and the response we have seen to Vancomycin. She has failed Bactrim and Clinda and has a doxycycline allergy. Next best choice would be Linezolid.  -D/cIVVancomycin today -Initiate PO Linezolid, first dose this evening. Continue for four days for a total of 7 days abx.  -CBC daily -VTE ppx with Lovenox - Tylenol 650 q6 for pain control - Norco for breakthrough pain -  Anticipate discharge tomorrow.   Bowel regimen: - Have Miralax ordered PRN - Patient supplying flax seed per her preference  GERD: Dysphagia: Patient follows with GI outpatient. Most recent EGD 2019 showed mild erosive reflux esophagitis status post overeating dilation, small hiatal hernia and otherwise normal. Symptoms are stable per last GI evaluation.She will follow-up outpatient. -Continue home PPI and H2 blocker -Avoid NSAIDs -Can use as neededTUMS if worsening of symptoms   LOS: 2 days   Dorothyann Gibbs, Medical Student 07/25/2020, 10:42 AM

## 2020-07-25 NOTE — Plan of Care (Signed)
  Problem: Activity: Goal: Risk for activity intolerance will decrease Outcome: Progressing   

## 2020-07-26 ENCOUNTER — Other Ambulatory Visit (HOSPITAL_COMMUNITY): Payer: Self-pay | Admitting: Internal Medicine

## 2020-07-26 LAB — BASIC METABOLIC PANEL
Anion gap: 6 (ref 5–15)
BUN: 8 mg/dL (ref 8–23)
CO2: 30 mmol/L (ref 22–32)
Calcium: 9 mg/dL (ref 8.9–10.3)
Chloride: 105 mmol/L (ref 98–111)
Creatinine, Ser: 0.66 mg/dL (ref 0.44–1.00)
GFR calc non Af Amer: >60 mL/min (ref >60)
Glucose, Bld: 87 mg/dL (ref 70–99)
Potassium: 3.8 mmol/L (ref 3.5–5.1)
Sodium: 141 mmol/L (ref 135–145)

## 2020-07-26 LAB — CBC WITH DIFFERENTIAL/PLATELET
Abs Immature Granulocytes: 0.01 10*3/uL (ref 0.00–0.07)
Basophils Absolute: 0 10*3/uL (ref 0.0–0.1)
Basophils Relative: 1 %
Eosinophils Absolute: 0.2 10*3/uL (ref 0.0–0.5)
Eosinophils Relative: 3 %
HCT: 36.3 % (ref 36.0–46.0)
Hemoglobin: 11.6 g/dL — ABNORMAL LOW (ref 12.0–15.0)
Immature Granulocytes: 0 %
Lymphocytes Relative: 50 %
Lymphs Abs: 2.4 10*3/uL (ref 0.7–4.0)
MCH: 28.2 pg (ref 26.0–34.0)
MCHC: 32 g/dL (ref 30.0–36.0)
MCV: 88.3 fL (ref 80.0–100.0)
Monocytes Absolute: 0.5 10*3/uL (ref 0.1–1.0)
Monocytes Relative: 10 %
Neutro Abs: 1.7 10*3/uL (ref 1.7–7.7)
Neutrophils Relative %: 36 %
Platelets: 258 10*3/uL (ref 150–400)
RBC: 4.11 MIL/uL (ref 3.87–5.11)
RDW: 13.6 % (ref 11.5–15.5)
WBC: 4.8 10*3/uL (ref 4.0–10.5)
nRBC: 0 % (ref 0.0–0.2)

## 2020-07-26 MED ORDER — LINEZOLID 600 MG PO TABS: 600.0000 mg | ORAL_TABLET | Freq: Two times a day (BID) | ORAL | 0 refills | Status: DC

## 2020-07-26 MED ORDER — HYDROCODONE-ACETAMINOPHEN 10-325 MG PO TABS
1.0000 | ORAL_TABLET | Freq: Three times a day (TID) | ORAL | 0 refills | Status: AC | PRN
Start: 2020-07-26 — End: 2020-07-31

## 2020-07-26 MED FILL — LINEZOLID 600 MG TABS: 600 | 3 days supply | Qty: 7 | Fill #0

## 2020-07-26 NOTE — Care Management Important Message (Signed)
Important Message  Patient Details  Name: Traci Thompson MRN: [id number] Date of Birth: [birth year]   Medicare Important Message Given:  Yes   Patient left prior to IM delivery. IM document mailed to the patient home address.   Shaquille Janes 07/26/2020, 3:35 PM

## 2020-07-26 NOTE — Discharge Instructions (Signed)
Traci Thompson, it was a pleasure taking care of you. You were admitted for cellulitis of your right lower leg. You were treated here with intravenous vancomycin and then transitioned to an oral equivalent, linezolid, prior to discharge. We are discharging you home to complete your antibiotic course. You will take your linezolid twice daily for 7 additional doses (for a total course of 7 days) starting 07/26/20 evening through Monday, 07/29/20.  We would like you to follow-up with your primary care physician in the next week for hospital follow-up.  Take care!

## 2020-07-26 NOTE — Discharge Summary (Signed)
Name: Traci Thompson MRN: [id number] DOB: February 09, 1951 69 y.o. PCP: Maximiano Coss, MD  Date of Admission: 07/22/2020  3:34 PM Date of Discharge: 07/26/2020 Attending Physician: Dr. Carlynn Purl  Discharge Diagnosis:  1. Right lower extremity cellulitis  Discharge Medications: Allergies as of 07/26/2020      Reactions   Morphine Nausea Only, Rash, Other (See Comments)   Dizzy, headache, rash   Prednisone Swelling   Swollen face with flushing   Doxycycline Monohydrate    Swollen face, head pressure   Other Nausea Only   Darvocet- Dizziness   Adhesive [tape] Itching, Rash   Benzalkonium Itching, Rash   Cefdinir Nausea Only, Other (See Comments)   Dizzy   Codeine Nausea Only, Other (See Comments)   dizzy   Latex Itching, Rash   Methylprednisolone Nausea Only, Other (See Comments)   dizzy   Propoxyphene Nausea Only, Other (See Comments)   dizzy   Rifampin Nausea Only, Other (See Comments)   Dizzy, flushing   Warfarin Sodium Nausea Only, Other (See Comments)   dizzy      Medication List    STOP taking these medications   cephALEXin 500 MG capsule Commonly known as: KEFLEX   clindamycin 300 MG capsule Commonly known as: CLEOCIN     TAKE these medications   acetaminophen 650 MG CR tablet Commonly known as: TYLENOL Take 1,300 mg by mouth every 8 (eight) hours as needed for pain.   aspirin EC 81 MG tablet Take 81 mg by mouth daily.   CALCIUM LACTATE PO Take 1-2 tablets by mouth See admin instructions. 2 tablets in the AM and 1 in the PM   clotrimazole-betamethasone cream Commonly known as: LOTRISONE Apply 1 application topically 2 (two) times daily as needed (rash).   ezetimibe 10 MG tablet Commonly known as: ZETIA Take 10 mg by mouth daily.   famotidine 20 MG tablet Commonly known as: PEPCID Take 1 tablet (20 mg total) by mouth at bedtime.   furosemide 40 MG tablet Commonly known as: LASIX Take 40 mg by mouth daily as needed for fluid.     guaiFENesin 600 MG 12 hr tablet Commonly known as: MUCINEX Take 600 mg by mouth 2 (two) times daily as needed for cough.   HYDROcodone-acetaminophen 10-325 MG tablet Commonly known as: NORCO Take 1 tablet by mouth every 8 (eight) hours as needed for up to 5 days. What changed:   when to take this  reasons to take this   linezolid 600 MG tablet Commonly known as: ZYVOX Take 1 tablet (600 mg total) by mouth every 12 (twelve) hours for 7 doses.   meclizine 25 MG tablet Commonly known as: ANTIVERT Take 25 mg by mouth 2 (two) times daily as needed for dizziness.   meloxicam 15 MG tablet Commonly known as: MOBIC Take 15 mg by mouth as needed for pain.   OMEGA 3 PO Take 1 tablet by mouth 2 (two) times daily.   omeprazole 40 MG capsule Commonly known as: PRILOSEC TAKE 1 CAPSULE BY MOUTH EVERY DAY What changed: how much to take   Praluent 150 MG/ML Soaj Generic drug: Alirocumab Inject into the skin every 14 (fourteen) days.   PRESERVISION AREDS 2 PO Take 1 tablet by mouth 2 (two) times daily.   PROBIOTIC PO Take 1 capsule by mouth daily.   UNABLE TO FIND 3 tablets daily. Med Name: Catalyn multivitamin multi-mineral   UNABLE TO FIND Place 1 drop into both eyes as needed. Med Name: Moxeza/Moxifloxacin 0.05%  eye drops 3 mL.   vitamin C 1000 MG tablet Take 1,000 mg by mouth daily.   Vitamin D3 125 MCG (5000 UT) Tabs Take 10,000 Units by mouth daily.       Disposition and follow-up:   Traci Thompson was discharged from [facility] in Good condition.  At the hospital follow up visit please address:  1.  Right lower leg cellulitis: - Ensure patient completed her course of antibiotics. She was discharged to complete an additional 7 doses of PO linezolid (BID dosing, last day Monday, 07/29/20).   Health Maintenance: Discussed safety and efficacy of COVID vaccine and answered patient questions. She declined vaccination in hospital as she has some  lingering concerns related to stories she has heard from friends and neighbor. - Continue to offer vaccination  2.  Labs / imaging needed at time of follow-up: none  3.  Pending labs/ test needing follow-up: none  Follow-up Appointments:  Follow-up Information    Hungarland, Carlena Bjornstad, MD. Schedule an appointment as soon as possible for a visit in 1 week(s).   Specialty: Family Medicine Contact information: [facility] [address] [place] [address] [phone]               Hospital Course by problem list:  Right lower leg cellulitis Ms. Kathan presented to the [facility] on 07/22/20 with several days of progressive pain, edema, erythema, and warmth of her R lower leg. She had previously been seen by her PCP and in the [facility] ED and had failed outpatient oral antibiotic therapy (Keflex and Clindamycin) for cellulitis. Prior Doppler was negative for DVT and Xray negative for osseous involvement. CT in [facility] consistent with cellulitis without concern for osseous involvement. She was started on IV Vancomycin and saw some improvement in symptoms within 24 hours. She was transitioned to PO Linezolid (due to her failure of Keflex/Clinda and her allergy to doxycycline) in preparation for discharge. She tolerated her first linezolid dose well and was discharged with instructions to complete 7 day course.    Discharge Vitals:   BP (!) 150/87 (BP Location: Right Arm)   Pulse 70   Temp 98 F (36.7 C) (Oral)   Resp 20   Ht 5\' 5"  (1.651 m)   Wt 123 kg   SpO2 96%   BMI 45.12 kg/m   Pertinent Labs, Studies, and Procedures:   CT OF THE LOWER RIGHT EXTREMITY WITH CONTRAST  FINDINGS: Bones/Joint/Cartilage  Status post right total knee arthroplasty. Arthroplasty components are in their expected alignment. No evidence of periprosthetic lucency or fracture by CT. Trace right knee joint effusion. Elsewhere, no acute fracture. No dislocation. No bony  erosion or evidence of periostitis. A tendon anchor is present within the posterior calcaneus related to prior Achilles tendon surgery. There are bidirectional calcaneal enthesophytes. No tibiotalar or subtalar joint effusion seen.  Ligaments  Suboptimally assessed by CT.  Muscles and Tendons  Preserved muscle bulk without significant atrophy or fatty infiltration. Thickened appearance of the distal Achilles tendon likely representing a combination of tendinosis and prior postsurgical change. Remaining visualized tendinous structures of the lower leg appear intact.  Soft tissues  There is mild circumferential subcutaneous edema at the mid to distal aspect of the right lower leg. No organized fluid collection. No deep fascial fluid or fluid collection. No soft tissue gas. Circumferential nonspecific calcifications within the subcutaneous soft tissues of the mid to distal aspect of the lower leg.  IMPRESSION: 1.  Mild circumferential subcutaneous edema at the mid to distal aspect of the right lower leg, which may represent cellulitis in the appropriate clinical setting. No organized fluid collection or soft tissue gas. There are nonspecific calcifications within the subcutaneous soft tissues within this region. A component of calciphylaxis is not entirely excluded. 2. 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 3. Thickened appearance of the distal Achilles tendon likely representing a combination of tendinosis and prior postsurgical change.  Discharge Instructions: Discharge Instructions    Call MD for:  persistant dizziness or light-headedness   Complete by: As directed    Call MD for:  redness, tenderness, or signs of infection (pain, swelling, redness, odor or green/yellow discharge around incision site)   Complete by: As directed    Call MD for:  severe uncontrolled pain   Complete by: As directed    Increase activity slowly   Complete  by: As directed      Ms. Amison, it was a pleasure taking care of you. You were admitted for cellulitis of your right lower leg. You were treated here with intravenous vancomycin and then transitioned to an oral equivalent, linezolid, prior to discharge. We are discharging you home to complete your antibiotic course. You will take your linezolid twice daily for 7 additional doses (for a total course of 7 days) starting 07/26/20 evening through Monday, 07/29/20.  We would like you to follow-up with your primary care physician in the next week for hospital follow-up.  Take care!   Signed: Alphonzo Severance, MD 07/26/2020, 3:15 PM   Pager: [phone]

## 2020-07-26 NOTE — Progress Notes (Signed)
   Subjective:  Traci Thompson reports that she is feeling better this morning but "still not as good as I would like." She is still having pain, but is improved from previous. She states that the cellulitis appears improved to her. She has been in touch with her pharmacy and her insurance company to be sure she can receive her Linezolid. She is requesting a few Norco to help with nocturnal pain to improve sleep once she returns home.   Objective:  Vital signs in last 24 hours: Vitals:   07/25/20 0853 07/25/20 1630 07/25/20 2133 07/26/20 0445  BP: 108/63 (!) 131/56 (!) 146/84 (!) 150/87  Pulse: 78 67 66 70  Resp: 18 17 20    Temp: 97.9 F (36.6 C) 98 F (36.7 C) 97.9 F (36.6 C) 98 F (36.7 C)  TempSrc: Oral Oral Oral Oral  SpO2: 100% 98% 97% 96%  Weight:      Height:       Weight change:   Intake/Output Summary (Last 24 hours) at 07/26/2020 1220 Last data filed at 07/26/2020 0900 Gross per 24 hour  Intake 1260 ml  Output 700 ml  Net 560 ml   Physical Exam Vitals and nursing note reviewed.  Constitutional:      General: She is not in acute distress. Cardiovascular:     Rate and Rhythm: Normal rate and regular rhythm.  Skin:    Comments: Right lower extremity with erythema, warmth, and edema all improved from yesterday. Area of confluent erythema withdrawing from outline marked on skin.  Neurological:     Mental Status: She is alert.     Assessment/Plan:  Active Problems:   Cellulitis  Right leg cellulitis: Patient presented with 2 weeks history of leg pain and swellingin 1 episode of fever 2 days ago.No history of ulceration ortrauma recently.FailedPO treatment with Bactrim, Keflex, and clindamycin. No fevers or leukocytosis in hospital. No blood cx obtained as patient had been on several days of abx. Xray 9/27with noacute bony abnormality.Doppler 9/27 negative for DVT.CT with circumferential edema c/w cellulitis, no organized fluid collection or soft tissue  gas. Signs and symptoms consistent with cellulitis from resistant organisms. Area of confluent erythema reduced from previous and pain is better controlled today.  Failed Keflex/Clinda. Allergic to doxycycline. Needs linezolid for MRSA coverage.Tolerated first dose last night.  - Continue PO linezolid for four days for a total of 7 days abx.  -Tylenol PRN for pain - Will provide Norco 5 days for nighttime pain - Encourage elevation during rest. - Activity as tolerated.    LOS: 3 days   10/27, Medical Student 07/26/2020, 12:20 PM

## 2020-07-26 NOTE — Progress Notes (Signed)
Patient given medication for Zyvox at bedside.  Patient given discharge instructions with verbal understanding.  Patients husband present and will take patient home.

## 2020-08-05 ENCOUNTER — Other Ambulatory Visit: Payer: Self-pay | Admitting: Gastroenterology

## 2020-08-05 DIAGNOSIS — K219 Gastro-esophageal reflux disease without esophagitis: Secondary | ICD-10-CM

## 2020-08-28 ENCOUNTER — Encounter: Payer: Self-pay | Admitting: Internal Medicine

## 2021-03-23 ENCOUNTER — Other Ambulatory Visit: Payer: Self-pay | Admitting: Gastroenterology

## 2021-03-23 DIAGNOSIS — K219 Gastro-esophageal reflux disease without esophagitis: Secondary | ICD-10-CM

## 2021-05-06 ENCOUNTER — Other Ambulatory Visit: Payer: Self-pay | Admitting: Gastroenterology

## 2021-05-06 DIAGNOSIS — K219 Gastro-esophageal reflux disease without esophagitis: Secondary | ICD-10-CM

## 2021-07-14 IMAGING — US US EXTREM LOW VENOUS*R*
1 series · 13 of 24 positions shown · non-contrast
Comparison: None.

CLINICAL DATA: Lower extremity pain and edema

EXAM:
RIGHT LOWER EXTREMITY VENOUS DUPLEX ULTRASOUND
TECHNIQUE: Gray-scale sonography with graded compression, as well as color
Doppler and duplex ultrasound were performed to evaluate the right
lower extremity deep venous system from the level of the common
femoral vein and including the common femoral, femoral, profunda
femoral, popliteal and calf veins including the posterior tibial,
peroneal and gastrocnemius veins when visible. The superficial great
saphenous vein was also interrogated. Spectral Doppler was utilized
to evaluate flow at rest and with distal augmentation maneuvers in
the common femoral, femoral and popliteal veins.

[Series 1: us extrem low venous*right* · 0.08mm/px · 13 of 47 slices shown]
[im 1/47]
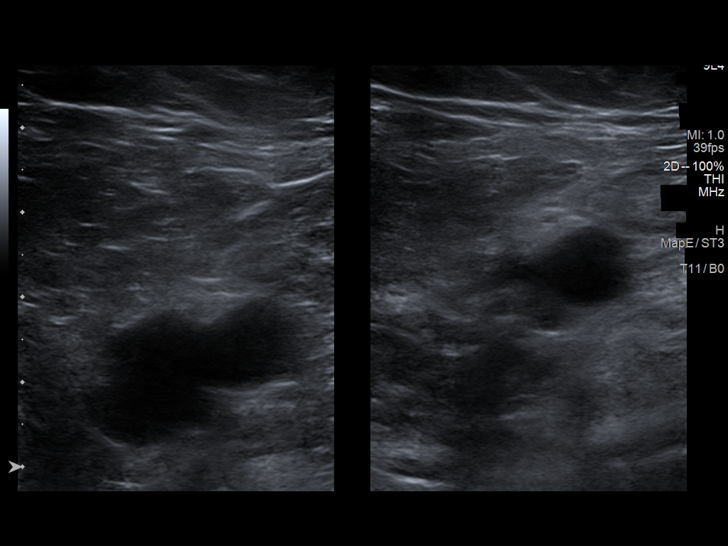
[im 5/47]
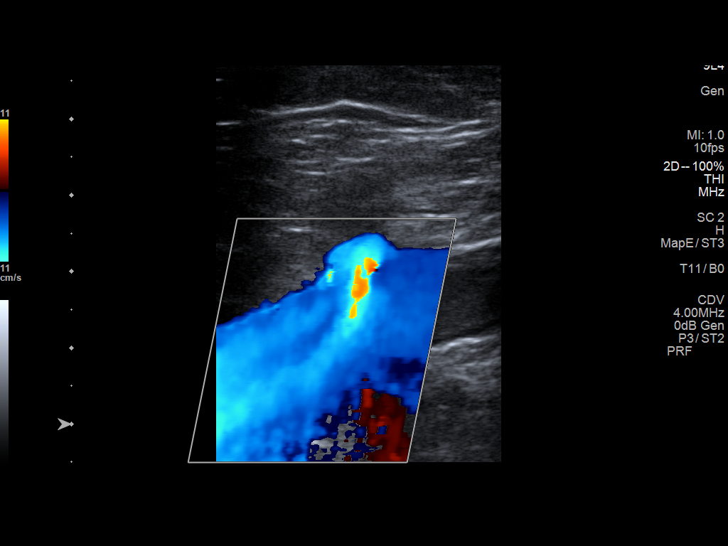
[im 9/47]
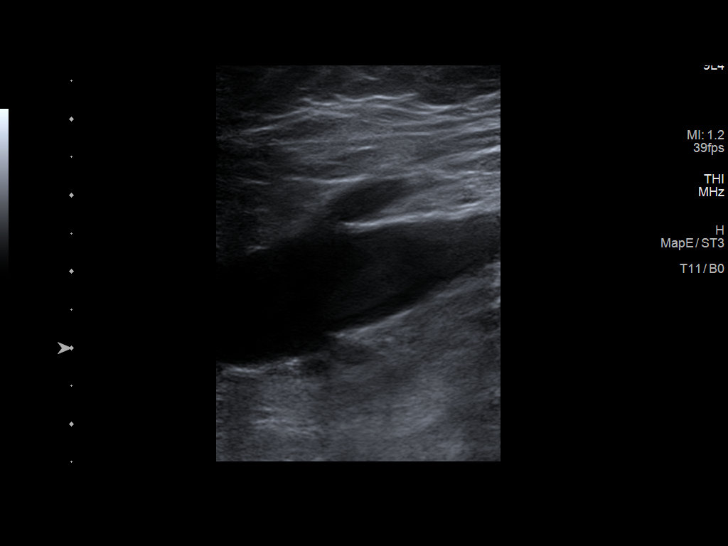
[im 13/47]
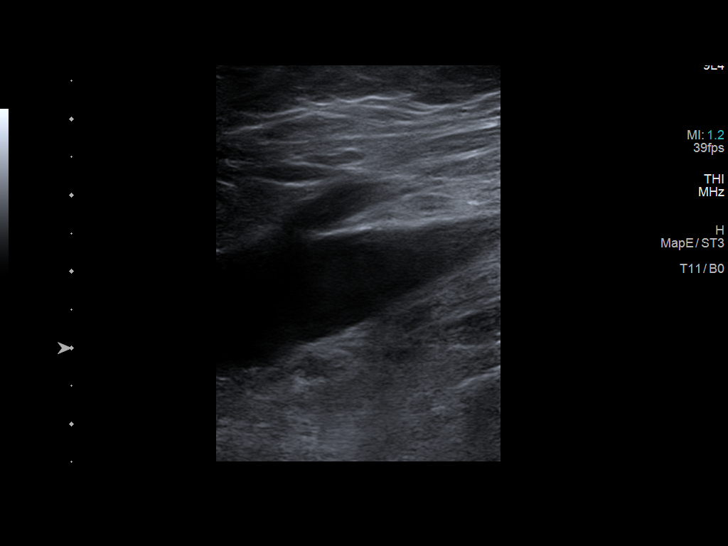
[im 17/47]
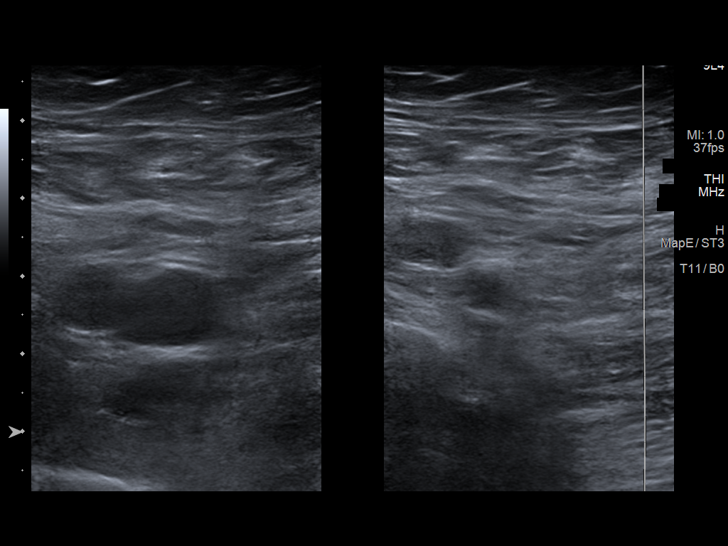
[im 21/47]
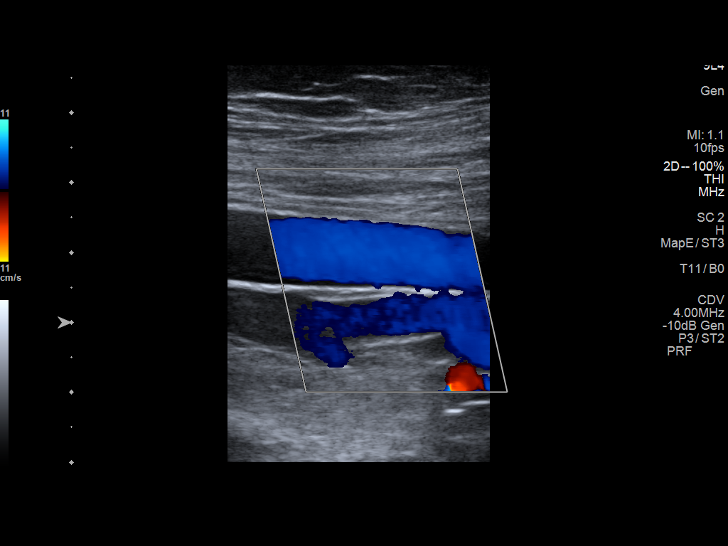
[im 25/47]
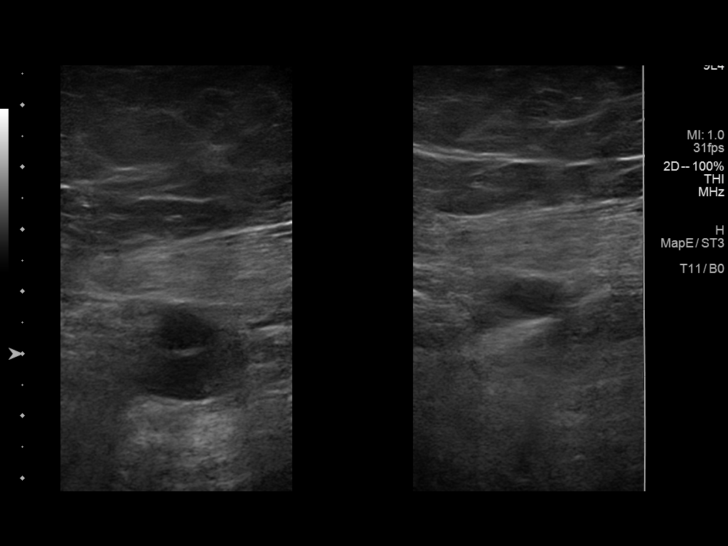
[im 27/47]
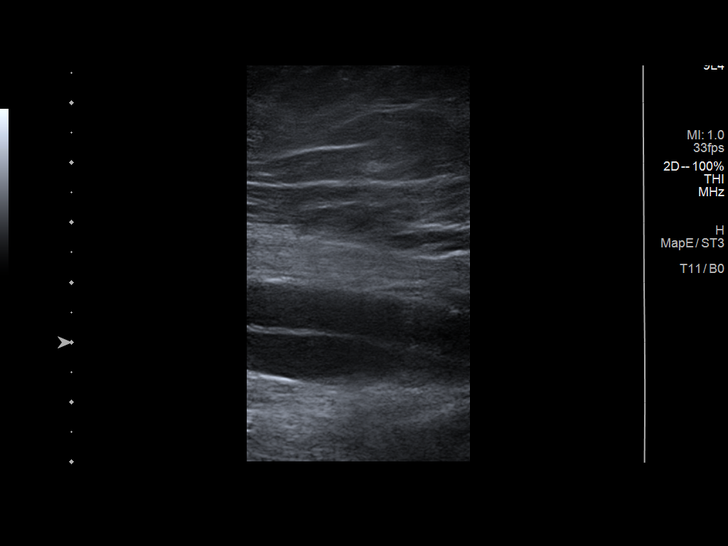
[im 31/47]
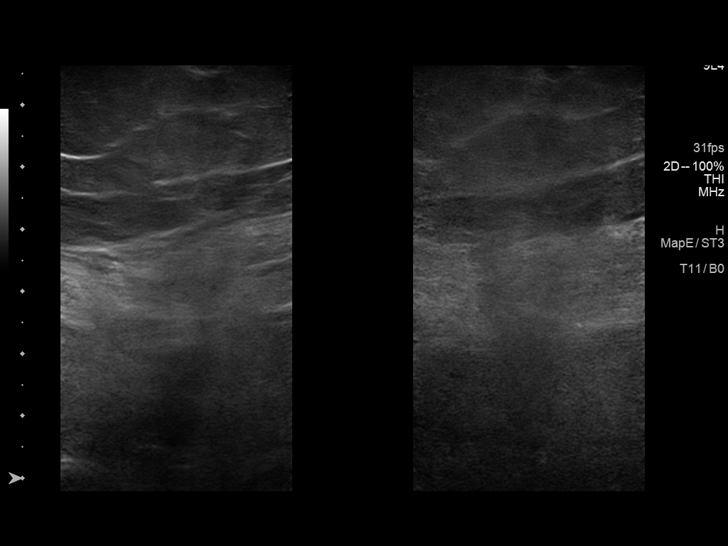
[im 35/47]
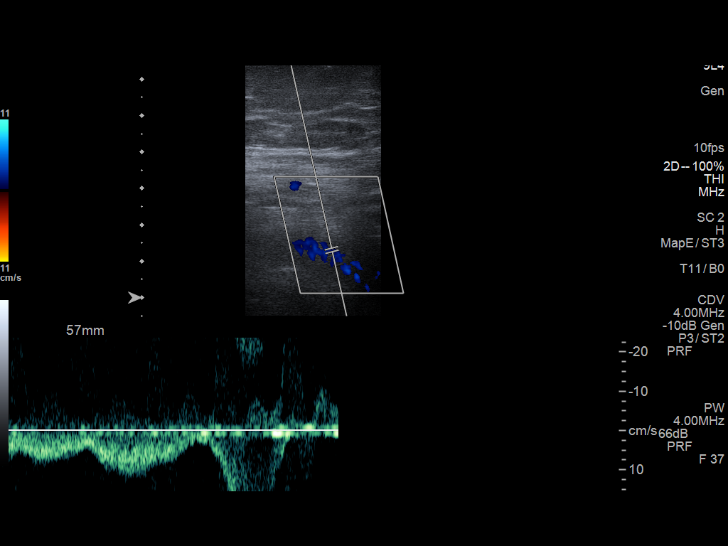
[im 39/47]
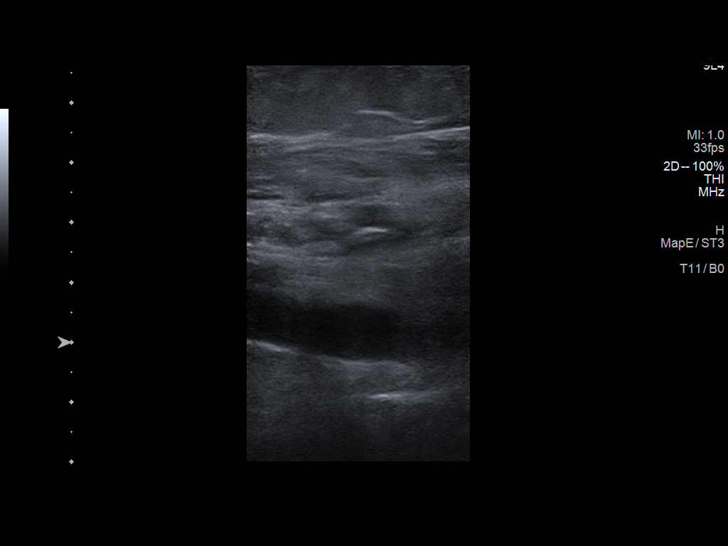
[im 43/47]
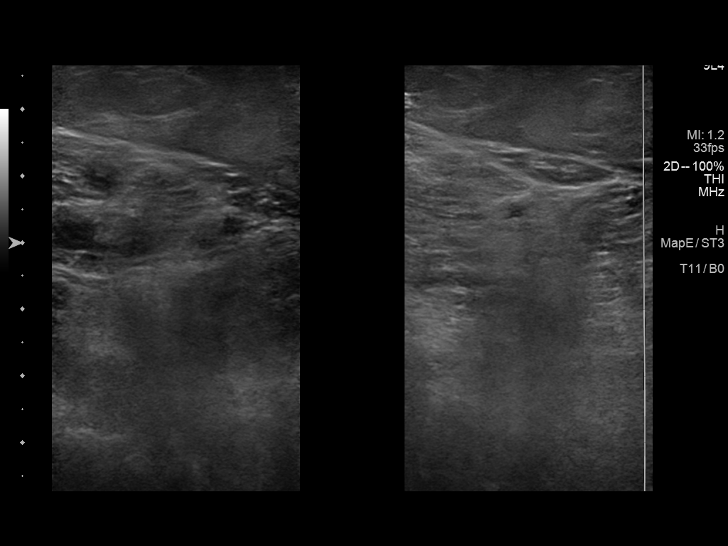
[im 47/47]
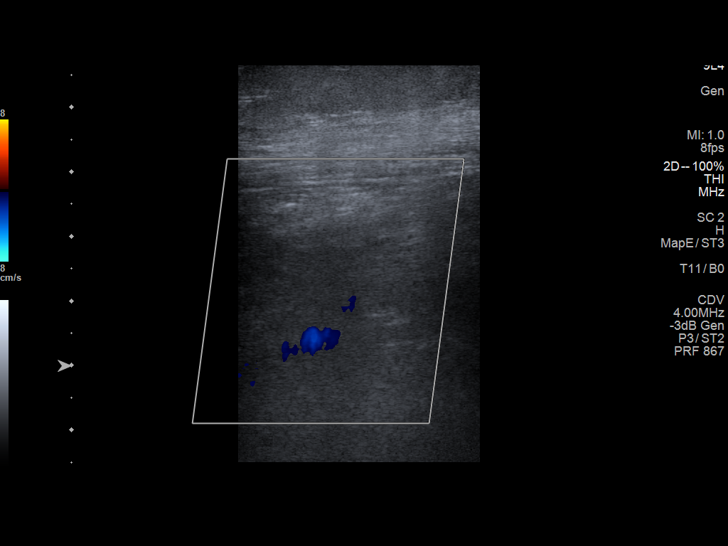

[13 of 24 positions shown; findings below may reference images not displayed]

FINDINGS: Contralateral Common Femoral Vein: Respiratory phasicity is normal
and symmetric with the symptomatic side. No evidence of thrombus.
Normal compressibility.

Common Femoral Vein: No evidence of thrombus. Normal
compressibility, respiratory phasicity and response to augmentation.

Saphenofemoral Junction: No evidence of thrombus. Normal
compressibility and flow on color Doppler imaging.

Profunda Femoral Vein: No evidence of thrombus. Normal
compressibility and flow on color Doppler imaging.

Femoral Vein: No evidence of thrombus. Normal compressibility,
respiratory phasicity and response to augmentation.

Popliteal Vein: No evidence of thrombus. Normal compressibility,
respiratory phasicity and response to augmentation.

Calf Veins: No evidence of thrombus. Normal compressibility and flow
on color Doppler imaging.

Superficial Great Saphenous Vein: No evidence of thrombus. Normal
compressibility.

Venous Reflux:  None.

Other Findings:  None.
IMPRESSION: No evidence of deep venous thrombosis in the right lower extremity.
Left common femoral vein also patent.

## 2021-09-16 ENCOUNTER — Other Ambulatory Visit: Payer: Self-pay | Admitting: Gastroenterology

## 2021-09-16 DIAGNOSIS — K219 Gastro-esophageal reflux disease without esophagitis: Secondary | ICD-10-CM

## 2021-09-17 ENCOUNTER — Encounter: Payer: Self-pay | Admitting: Internal Medicine

## 2021-09-17 NOTE — Telephone Encounter (Signed)
Refilled but needs follow up

## 2021-10-20 ENCOUNTER — Other Ambulatory Visit: Payer: Self-pay | Admitting: Gastroenterology

## 2021-10-20 DIAGNOSIS — K219 Gastro-esophageal reflux disease without esophagitis: Secondary | ICD-10-CM

## 2021-10-23 ENCOUNTER — Encounter: Payer: Self-pay | Admitting: Internal Medicine

## 2021-10-23 NOTE — Telephone Encounter (Signed)
Sending in limited refills.  Patient needs follow-up for further refills.

## 2021-10-27 NOTE — Telephone Encounter (Signed)
LMOM for pt to call office back 

## 2021-12-16 NOTE — Progress Notes (Signed)
Referring Provider: Maximiano Coss Primary Care Physician:  Maximiano Coss, MD Primary GI Physician: Dr. Jena Gauss  Chief Complaint  Patient presents with   Follow-up    HPI:   Traci Thompson is a 71 y.o. female presenting today for follow-up of GERD with need for medication refills.  She has chronic history of GERD with reflux esophagitis and also history of dysphagia with esophageal dilation in 2014 and 2019.  Last seen in our office in June 2021.  GERD well controlled on omeprazole 40 mg daily.  Her primary concern was about uncontrollable flatulence and bloating.  No other significant GI symptoms.  Recommended continuing current medications, keep food diary to identify triggers, reduce cruciferous vegetables, use Gas-X or Beano as needed.   Today: Taking omeprazole 40 mg in the morning and famotidine at bedtime. Reflux symptoms are under good control unless eating BBQ. No dysphagia. No nausea or vomiting.  Denies bowel changes, BRBPR, melena, unintentional weight loss.  Thinks she may have lactose intolerance as she has some gas after eating dairy products.  Does well with avoiding dairy.  Past Medical History:  Diagnosis Date   Chronic venous stasis    Complication of anesthesia    has been difficult to put to sleep in the past   DVT (deep venous thrombosis) (HCC)    Fibromyalgia    Hyperlipidemia    Osteoarthritis    Restrictive lung disease    Dr. Lorenza Evangelist    Past Surgical History:  Procedure Laterality Date   ABI  01/04/2015   looking for PAD, [facility]. Dr. Teofilo Pod.    APPENDECTOMY  [address] [place]   Black widow spider bite  2003   [place]   Bone spur and suture abscess excision Right 2006   Right foot: [place]   Bone spur excision  2004   Right ankle, [place]   CARDIAC CATHETERIZATION  03/2018   [facility]: nonobstructive CAD   COLONOSCOPY  2014   Dr. Elder Cyphers: Propofol: Left sided diverticulosis.  10-year  follow-up colonoscopy recommended   ESOPHAGOGASTRODUODENOSCOPY  2014   Dr. Elder Cyphers: Propofol.  Small hiatal hernia.  No obvious stricture or esophageal ring.  Empirically dilated with 54 bougie dilator.  Duodenal biopsy with mild focal villous blunting but no evidence of celiac. neg for H.pylori as well.  Esophageal biopsy benign.   ESOPHAGOGASTRODUODENOSCOPY (EGD) WITH PROPOFOL N/A 07/18/2018   Surgeon: Corbin Ade, MD;  Mild erosive reflux esophagitis. Status post esophageal dilation, small hiatal hernia.   Foot spur Left 2008   [facility] saphenous laser ablation  2005   [place]   great saphenous laser ablation Right 2012   [facility]   KNEE ARTHROSCOPY Right 1995   [place], [place]   left foot resection of achilles tendon spur  05/06/2007   [place] [facility]   MALONEY DILATION N/A 07/18/2018   Procedure: Elease Hashimoto DILATION;  Surgeon: Corbin Ade, MD;  Location: [facility];  Service: Endoscopy;  Laterality: N/A;   Open stasis ulcer  1982   [place], [place]   PARTIAL HYSTERECTOMY  1980   Uterus only, [place], [place]   SINUS EXPLORATION  1988   [place],  [place]   Skin grafts  2004   Brown recluse spider bite-lateral right ankle, open stasis ulcer-medial right ankle [facility] [place]   TOTAL KNEE ARTHROPLASTY Right 2007   [place]   TOTAL KNEE ARTHROPLASTY Left [place]  venous reflux  01/07/2015   [facility], Dr. Teofilo Pod     Current Outpatient Medications  Medication Sig Dispense Refill   acetaminophen (TYLENOL) 650 MG CR tablet Take 1,300 mg by mouth every 8 (eight) hours as needed for pain.      Alirocumab (PRALUENT) 150 MG/ML SOAJ Inject into the skin every 14 (fourteen) days.     Ascorbic Acid (VITAMIN C) 1000 MG tablet Take 1,000 mg by mouth daily.     aspirin EC 81 MG tablet Take 81 mg by mouth daily.     CALCIUM LACTATE PO Take 1-2 tablets by mouth See admin instructions. 2 tablets in the AM and 1 in  the PM      Cholecalciferol (VITAMIN D3) 5000 units TABS Take 10,000 Units by mouth daily.      clotrimazole-betamethasone (LOTRISONE) cream Apply 1 application topically 2 (two) times daily as needed (rash).      ezetimibe (ZETIA) 10 MG tablet Take 10 mg by mouth daily.     furosemide (LASIX) 40 MG tablet Take 40 mg by mouth daily as needed for fluid.      meclizine (ANTIVERT) 25 MG tablet Take 25 mg by mouth 2 (two) times daily as needed for dizziness.      meloxicam (MOBIC) 15 MG tablet Take 15 mg by mouth as needed for pain.     Multiple Vitamins-Minerals (PRESERVISION AREDS 2 PO) Take 1 tablet by mouth 2 (two) times daily.      Omega-3 Fatty Acids (OMEGA 3 PO) Take 1 tablet by mouth 2 (two) times daily.     Probiotic Product (PROBIOTIC PO) Take 1 capsule by mouth daily.     UNABLE TO FIND 3 tablets daily. Med Name: Catalyn multivitamin multi-mineral     UNABLE TO FIND Place 1 drop into both eyes as needed. Med Name: Moxeza/Moxifloxacin 0.05% eye drops 3 mL.     famotidine (PEPCID) 20 MG tablet TAKE 1 TABLET BY MOUTH EVERYDAY AT BEDTIME 90 tablet 3   omeprazole (PRILOSEC) 40 MG capsule Take 1 capsule (40 mg total) by mouth daily before breakfast. 90 capsule 3   No current facility-administered medications for this visit.    Allergies as of 12/17/2021 - Review Complete 12/17/2021  Allergen Reaction Noted   Morphine Nausea Only, Rash, and Other (See Comments) 05/09/2018   Prednisone Swelling 01/04/2019   Doxycycline monohydrate  11/22/2019   Other Nausea Only 11/22/2019   Adhesive [tape] Itching and Rash 05/09/2018   Benzalkonium Itching and Rash 05/09/2018   Cefdinir Nausea Only and Other (See Comments) 05/09/2018   Codeine Nausea Only and Other (See Comments) 05/09/2018   Latex Itching and Rash 05/09/2018   Methylprednisolone Nausea Only and Other (See Comments) 05/09/2018   Propoxyphene Nausea Only and Other (See Comments) 05/09/2018   Rifampin Nausea Only and Other (See Comments)  05/09/2018   Warfarin sodium Nausea Only and Other (See Comments) 05/09/2018    Family History  Problem Relation Age of Onset   Urinary tract infection Mother 55       died from UTI/infection   Leukemia Father 91   Hypercholesterolemia Other    Heart Problems Paternal Grandmother    Asthma Son    Leukemia Daughter    Endometrial cancer Daughter    Colon cancer Neg Hx     Social History   Socioeconomic History   Marital status: Married    Spouse name: Not on file   Number of children: 6   Years  of education: Not on file   Highest education level: Master's degree (e.g., MA, MS, MEng, MEd, MSW, MBA)  Occupational History   Occupation: retired Scientist, research (physical sciences)  Tobacco Use   Smoking status: Never   Smokeless tobacco: Never  Substance and Sexual Activity   Alcohol use: Never   Drug use: Never   Sexual activity: Not on file  Other Topics Concern   Not on file  Social History Narrative   Lives at home with husband   Caffeine 1 cup of coffee/day   Social Determinants of Health   Financial Resource Strain: Not on file  Food Insecurity: Not on file  Transportation Needs: Not on file  Physical Activity: Not on file  Stress: Not on file  Social Connections: Not on file    Review of Systems: Gen: Denies fever, chills, cold or flulike symptoms, presyncope, syncope. CV: Denies chest pain, palpitations. Resp: Admits to shortness of breath with exertion.  No shortness of breath at rest.  No cough. GI: See HPI Derm: Denies rash Psych: Denies depression, anxiety. Heme: See HPI  Physical Exam: BP 128/72   Pulse 90   Temp 98 F (36.7 C) (Temporal)   Ht 5\' 5"  (1.651 m)   Wt 281 lb 6.4 oz (127.6 kg)   BMI 46.83 kg/m  General:   Alert and oriented. No distress noted. Pleasant and cooperative.  Head:  Normocephalic and atraumatic. Eyes:  Conjuctiva clear without scleral icterus. Heart:  S1, S2 present without murmurs appreciated. Lungs:  Clear to auscultation  bilaterally. No wheezes, rales, or rhonchi. No distress.  Abdomen:  +BS, soft, non-tender and non-distended. No rebound or guarding. No HSM or masses noted. Msk:  Symmetrical without gross deformities. Normal posture. Extremities:  Without edema. Neurologic:  Alert and  oriented x4 Psych: Normal mood and affect.    Assessment: 71 year old female presenting today for follow-up.  She has chronic history of GERD with reflux esophagitis as well as history of dysphagia with esophageal dilation in 2014 and 2019 with good clinical response.  Clinically, she is doing very well on omeprazole 40 mg daily.  Famotidine 20 mg nightly.  No alarm symptoms. Reports she would like to limit her medications as much as she can and is asking about the possibility of antireflux surgery.  Discussed that I can place a referral for her to discuss antireflux surgery with someone.  Patient states that she will continue to think on this for now.   Colonoscopy is up to date, due for repeat in 2024.   Plan:  Continue omeprazole 40 mg daily 30 minutes before breakfast.  Prescription sent to pharmacy. Continue famotidine 20 mg nightly.  Prescription sent to pharmacy.   Advised that she can try stopping famotidine and monitor for recurrent reflux symptoms.  If she continues to do well without famotidine and would like to try reducing the dose of omeprazole, requested she let me know. Colonoscopy in 2024  Follow-up in 1 year or sooner if needed.   Ermalinda Memos, PA-C [facility] 12/17/2021

## 2021-12-17 ENCOUNTER — Ambulatory Visit (INDEPENDENT_AMBULATORY_CARE_PROVIDER_SITE_OTHER): Payer: Medicare Other | Admitting: Gastroenterology

## 2021-12-17 ENCOUNTER — Other Ambulatory Visit: Payer: Self-pay

## 2021-12-17 ENCOUNTER — Encounter: Payer: Self-pay | Admitting: Gastroenterology

## 2021-12-17 VITALS — BP 128/72 | HR 90 | Temp 98.0°F | Ht 65.0 in | Wt 281.4 lb

## 2021-12-17 DIAGNOSIS — K219 Gastro-esophageal reflux disease without esophagitis: Secondary | ICD-10-CM | POA: Diagnosis not present

## 2021-12-17 MED ORDER — FAMOTIDINE 20 MG PO TABS: ORAL_TABLET | ORAL | 3 refills | Status: DC

## 2021-12-17 MED ORDER — OMEPRAZOLE 40 MG PO CPDR: 40.0000 mg | DELAYED_RELEASE_CAPSULE | Freq: Every day | ORAL | 3 refills | Status: DC

## 2021-12-17 NOTE — Patient Instructions (Addendum)
Continue omeprazole 40 mg daily 30 minutes before breakfast.  I have sent a refill to your pharmacy. ? ?Continue famotidine 20 mg nightly.  I have sent a refill to your pharmacy.   ? ?As you would like to reduce your medications if possible, you can try stopping famotidine and monitor for recurrent reflux symptoms.  If he continues to do well with omeprazole only, we can always consider decreasing the dose of this medication in the future. You can call an let me know and we can make changes over the phone.  ? ?We will plan to see back in 1 year or sooner if needed. ? ?Ermalinda Memos, PA-C ?[facility] ? ? ? ?

## 2022-10-28 ENCOUNTER — Encounter: Payer: Self-pay | Admitting: Internal Medicine

## 2022-11-24 ENCOUNTER — Ambulatory Visit (INDEPENDENT_AMBULATORY_CARE_PROVIDER_SITE_OTHER): Payer: Medicare Other | Admitting: Gastroenterology

## 2022-11-24 ENCOUNTER — Encounter: Payer: Self-pay | Admitting: Gastroenterology

## 2022-11-24 VITALS — BP 116/76 | HR 87 | Temp 97.9°F | Ht 65.0 in | Wt 286.6 lb

## 2022-11-24 DIAGNOSIS — K2100 Gastro-esophageal reflux disease with esophagitis, without bleeding: Secondary | ICD-10-CM

## 2022-11-24 DIAGNOSIS — Z1211 Encounter for screening for malignant neoplasm of colon: Secondary | ICD-10-CM | POA: Diagnosis not present

## 2022-11-24 NOTE — Patient Instructions (Addendum)
As discussed today we will try weaning you off of acid reflux medication. See handouts provided that can help you avoid activities or foods that can trigger acid reflux.  You can stop famotidine at any time. If you tolerate being off famotidine for one week, then start weaning of omeprazole.  To wean off omeprazole, decrease to every other day for one week. If you tolerate this, decrease to 3 times per week. Ultimately stopping if you are doing well.  For occasional reflux/heartburn symptoms you can use over the counter antacids as needed or even famotidine as needed (it works faster than omeprazole and is more suitable to use as needed).  You are due for a colonoscopy at any time. Please call when you are ready to proceed.  We will plan for return office visit in one year but you can communicate by phone or mychart messages at any time if you are ready to pursue colonoscopy or if you are having trouble with your reflux.

## 2022-11-24 NOTE — Progress Notes (Unsigned)
GI Office Note    Referring Provider: Yvone Neu Primary Care Physician:  Yvone Neu, MD  Primary Gastroenterologist: Garfield Cornea, MD   Chief Complaint   Chief Complaint  Patient presents with   Follow-up    Wants to discuss discontinuing omeprazole and famotidine due to side effects and articles online.    History of Present Illness   Traci Thompson is a 72 y.o. female presenting today for follow up. Last seen in 12/2021. History of GERD with reflux esophagitis and dysphagia s/p dilation in 2014/2019. She is due for colonoscopy this year.   She wants to discuss reflux medications. She is concerned about potential side effects. She is trying to avoid food triggers and follow antireflux measures. She states that reading the side effect profile of both omeprazole and famotidine that SOB is listed. She reports her biggest symptoms is SOB that affects her quality of life. She has been both cardiology and pulmonology and reports they have not found anything to explain her symptoms. Some days are worse then others. She has appt to see pharm D this afternoon to discuss all of her medications. She denies any bowel concerns. No melena, brbpr. No dysphagia. No heartburn. No unintentional weight loss.     Medications   Current Outpatient Medications  Medication Sig Dispense Refill   acetaminophen (TYLENOL) 650 MG CR tablet Take 1,300 mg by mouth every 8 (eight) hours as needed for pain.      Alirocumab (PRALUENT) 150 MG/ML SOAJ Inject into the skin every 14 (fourteen) days.     Ascorbic Acid (VITAMIN C) 1000 MG tablet Take 1,000 mg by mouth daily.     aspirin EC 81 MG tablet Take 81 mg by mouth daily.     Bempedoic Acid-Ezetimibe (NEXLIZET) 180-10 MG TABS Take 1 tablet by mouth daily.     CALCIUM LACTATE PO Take 1-2 tablets by mouth See admin instructions. 2 tablets in the AM and 1 in the PM      Cholecalciferol (VITAMIN D3) 5000 units TABS Take 10,000 Units by  mouth daily.      clotrimazole-betamethasone (LOTRISONE) cream Apply 1 application topically 2 (two) times daily as needed (rash).      Coenzyme Q10 10 MG capsule Take 10 mg by mouth daily.     cyanocobalamin (VITAMIN B12) 1000 MCG tablet Take 1,000 mcg by mouth daily.     famotidine (PEPCID) 20 MG tablet TAKE 1 TABLET BY MOUTH EVERYDAY AT BEDTIME 90 tablet 3   furosemide (LASIX) 40 MG tablet Take 40 mg by mouth daily as needed for fluid.      liothyronine (CYTOMEL) 5 MCG tablet Take 20 mcg by mouth every morning.     meclizine (ANTIVERT) 25 MG tablet Take 25 mg by mouth 2 (two) times daily as needed for dizziness.      Multiple Vitamins-Minerals (PRESERVISION AREDS 2 PO) Take 1 tablet by mouth 2 (two) times daily.      Omega-3 Fatty Acids (OMEGA 3 PO) Take 1 tablet by mouth 2 (two) times daily.     omeprazole (PRILOSEC) 40 MG capsule Take 1 capsule (40 mg total) by mouth daily before breakfast. 90 capsule 3   Probiotic Product (PROBIOTIC PO) Take 1 capsule by mouth daily.     Selenium 100 MCG TABS Take 1 tablet by mouth daily.     triamcinolone (NASACORT) 55 MCG/ACT AERO nasal inhaler Place into the nose.     triamcinolone cream (KENALOG) 0.1 %  UNABLE TO FIND 3 tablets daily. Med Name: Catalyn multivitamin multi-mineral     UNABLE TO FIND Place 1 drop into both eyes as needed. Med Name: Moxeza/Moxifloxacin 0.05% eye drops 3 mL.     No current facility-administered medications for this visit.    Allergies   Allergies as of 11/24/2022 - Review Complete 11/24/2022  Allergen Reaction Noted   Morphine Nausea Only, Rash, and Other (See Comments) 05/09/2018   Prednisone Swelling 01/04/2019   Doxycycline monohydrate  11/22/2019   Other Nausea Only 11/22/2019   Adhesive [tape] Itching and Rash 05/09/2018   Benzalkonium Itching and Rash 05/09/2018   Cefdinir Nausea Only and Other (See Comments) 05/09/2018   Codeine Nausea Only and Other (See Comments) 05/09/2018   Latex Itching and Rash  05/09/2018   Methylprednisolone Nausea Only and Other (See Comments) 05/09/2018   Propoxyphene Nausea Only and Other (See Comments) 05/09/2018   Rifampin Nausea Only and Other (See Comments) 05/09/2018   Warfarin sodium Nausea Only and Other (See Comments) 05/09/2018          Review of Systems   General: Negative for anorexia, weight loss, fever, chills, fatigue, weakness. ENT: Negative for hoarseness, difficulty swallowing , nasal congestion. CV: Negative for chest pain, angina, palpitations, +dyspnea on exertion, peripheral edema.  Respiratory: Negative for dyspnea at rest, +dyspnea on exertion, cough, sputum, wheezing.  GI: See history of present illness. GU:  Negative for dysuria, hematuria, urinary incontinence, urinary frequency, nocturnal urination.  Endo: Negative for unusual weight change.     Physical Exam   BP 116/76 (BP Location: Right Arm, Patient Position: Sitting, Cuff Size: Large) Comment (Cuff Size): thigh cuff  Pulse 87   Temp 97.9 F (36.6 C) (Oral)   Ht 5\' 5"  (1.651 m)   Wt 286 lb 9.6 oz (130 kg)   SpO2 97%   BMI 47.69 kg/m    General: Well-nourished, well-developed in no acute distress.  Eyes: No icterus. Mouth: Oropharyngeal mucosa moist and pink  . Lungs: Clear to auscultation bilaterally.  Heart: Regular rate and rhythm, no murmurs rubs or gallops.  Abdomen: Bowel sounds are normal, nontender, nondistended, no hepatosplenomegaly or masses,  no abdominal bruits or hernia , no rebound or guarding.  Extremities: No lower extremity edema. No clubbing or deformities. Neuro: Alert and oriented x 4   Skin: Warm and dry, no jaundice.   Psych: Alert and cooperative, normal mood and affect.  Labs   None available  Imaging Studies   No results found.  Assessment   GERD: history of reflux esophagitis and dysphagia in past. Has been doing well with reflux over the past couple of years. She is interested in coming off PPI and H2 blockers due to side  effect profile. We discussed that we can certainly try to wean her off. She will need to adhere to antireflux measures, avoiding trigger foods.   She is due for screening colonoscopy this year. Last colonoscopy in 2014 by Dr. Algis Greenhouse. She is not quite ready to schedule at this time.    PLAN   We will trying weaning off acid reflux medications. It will be very important that she follow antireflux measures and avoid food triggers.  She can stop famotidine now. If she tolerates that, after one week, she will start weaning of omeprazole over the next couple of weeks.  For occasional reflux symptoms she can use famotidine as needed or OTC antacids.  She will call when she is ready to schedule colonoscopy.  Return office  visit in one year.    Laureen Ochs. Bobby Rumpf, [place], [facility]

## 2022-11-26 ENCOUNTER — Encounter: Payer: Self-pay | Admitting: Gastroenterology

## 2022-12-12 ENCOUNTER — Other Ambulatory Visit: Payer: Self-pay | Admitting: Gastroenterology

## 2022-12-12 DIAGNOSIS — K219 Gastro-esophageal reflux disease without esophagitis: Secondary | ICD-10-CM

## 2022-12-14 NOTE — Telephone Encounter (Signed)
Patient was going to try and wean off pepcid. I will send in RX in case she needs. It.

## 2023-06-08 ENCOUNTER — Telehealth: Payer: Self-pay | Admitting: Gastroenterology

## 2023-06-08 NOTE — Telephone Encounter (Signed)
Patient was here in the office while her husband was being seen. She is ready to schedule her screening colonoscopy. I last saw her in 11/2022.   She can be triaged to update her medication list. She will be an ASA 3 due to her BMI. She will not need an ov.

## 2023-06-09 NOTE — Telephone Encounter (Signed)
Questionnaire sent.

## 2023-06-29 ENCOUNTER — Telehealth: Payer: Self-pay | Admitting: *Deleted

## 2023-06-29 ENCOUNTER — Telehealth: Payer: Self-pay | Admitting: Internal Medicine

## 2023-06-29 NOTE — Telephone Encounter (Signed)
Procedure: colonoscopy  Height: 5'5: Weight: 248 lb      BMI: 41.3  Have you had a colonoscopy before?  Yes, 2014, Dr.Shiflett  Do you have family history of colon cancer?  no  Do you have a family history of polyps? unknown  Previous colonoscopy with polyps removed? No report data  Do you have a history colorectal cancer?   no  Are you diabetic?  no  Do you have a prosthetic or mechanical heart valve? no  Do you have a pacemaker/defibrillator?   no  Have you had endocarditis/atrial fibrillation?  no  Do you use supplemental oxygen/CPAP?  no  Have you had joint replacement within the last 12 months?  no  Do you tend to be constipated or have to use laxatives?  no   Do you have history of alcohol use? If yes, how much and how often.  no  Do you have history or are you using drugs? If yes, what do are you  using?  no  Have you ever had a stroke/heart attack?  no  Have you ever had a heart or other vascular stent placed,?no  Do you take weight loss medication? no  female patients,: have you had a hysterectomy? yes                              are you post menopausal?  yes                              do you still have your menstrual cycle? no    Date of last menstrual period? N/a  Do you take any blood-thinning medications such as: (Plavix, aspirin, Coumadin, Aggrenox, Brilinta, Xarelto, Eliquis, Pradaxa, Savaysa or Effient)? Aspirin  If yes we need the name, milligram, dosage and who is prescribing doctor:  81 mg             Current Outpatient Medications  Medication Sig Dispense Refill   acetaminophen (TYLENOL) 650 MG CR tablet Take 1,300 mg by mouth every 8 (eight) hours as needed for pain.      Alirocumab (PRALUENT) 150 MG/ML SOAJ Inject into the skin every 14 (fourteen) days.     Ascorbic Acid (VITAMIN C) 1000 MG tablet Take 1,000 mg by mouth daily.     aspirin EC 81 MG tablet Take 81 mg by mouth daily.     Bempedoic Acid-Ezetimibe (NEXLIZET) 180-10 MG TABS  Take 1 tablet by mouth daily.     CALCIUM LACTATE PO Take 1-2 tablets by mouth See admin instructions. 2 tablets in the AM and 1 in the PM      Cholecalciferol (VITAMIN D3) 5000 units TABS Take 10,000 Units by mouth daily.      clotrimazole-betamethasone (LOTRISONE) cream Apply 1 application topically 2 (two) times daily as needed (rash).      Coenzyme Q10 10 MG capsule Take 10 mg by mouth daily.     cyanocobalamin (VITAMIN B12) 1000 MCG tablet Take 1,000 mcg by mouth daily.     furosemide (LASIX) 40 MG tablet Take 40 mg by mouth daily as needed for fluid.      liothyronine (CYTOMEL) 5 MCG tablet Take 20 mcg by mouth every morning.     meclizine (ANTIVERT) 25 MG tablet Take 25 mg by mouth 2 (two) times daily as needed for dizziness.      Multiple Vitamins-Minerals (PRESERVISION AREDS  2 PO) Take 1 tablet by mouth 2 (two) times daily.      Omega-3 Fatty Acids (OMEGA 3 PO) Take 1 tablet by mouth 2 (two) times daily.     Probiotic Product (PROBIOTIC PO) Take 1 capsule by mouth daily.     Selenium 100 MCG TABS Take 1 tablet by mouth daily.     triamcinolone (NASACORT) 55 MCG/ACT AERO nasal inhaler Place into the nose.     triamcinolone cream (KENALOG) 0.1 %      UNABLE TO FIND Place 1 drop into both eyes as needed. Med Name: Moxeza/Moxifloxacin 0.05% eye drops 3 mL.     No current facility-administered medications for this visit.    Allergies  Allergen Reactions   Morphine Nausea Only, Rash and Other (See Comments)    Dizzy, headache, rash   Prednisone Swelling    Swollen face with flushing   Doxycycline Monohydrate     Swollen face, head pressure   Other Nausea Only    Darvocet- Dizziness   Adhesive [Tape] Itching and Rash   Benzalkonium Itching and Rash   Cefdinir Nausea Only and Other (See Comments)    Dizzy   Codeine Nausea Only and Other (See Comments)    dizzy   Latex Itching and Rash   Methylprednisolone Nausea Only and Other (See Comments)    dizzy   Propoxyphene Nausea Only  and Other (See Comments)    dizzy   Rifampin Nausea Only and Other (See Comments)    Dizzy, flushing   Warfarin Sodium Nausea Only and Other (See Comments)    dizzy

## 2023-06-29 NOTE — Telephone Encounter (Signed)
Questionnaire is in review. Her TCS/Path from 2014 is in epic

## 2023-07-21 NOTE — Telephone Encounter (Signed)
OK to schedule. ASA 3.

## 2023-07-22 NOTE — Telephone Encounter (Signed)
LMTRC

## 2023-07-30 NOTE — Telephone Encounter (Signed)
Questionnaire from recall, no referral needed  

## 2023-07-30 NOTE — Telephone Encounter (Signed)
LMTRC

## 2023-07-30 NOTE — Telephone Encounter (Signed)
Patient called in. She does not want to schedule anything at this time. I asked if she was going to call back when she was ready and stated "sure". FYI

## 2023-08-02 ENCOUNTER — Other Ambulatory Visit: Payer: Self-pay | Admitting: Orthopedic Surgery

## 2023-08-02 DIAGNOSIS — M545 Low back pain, unspecified: Secondary | ICD-10-CM

## 2023-08-30 ENCOUNTER — Ambulatory Visit
Admission: RE | Admit: 2023-08-30 | Discharge: 2023-08-30 | Disposition: A | Discharge status: Home / Self Care | Payer: Medicare Other | Source: Ambulatory Visit | Attending: Orthopedic Surgery | Admitting: Orthopedic Surgery

## 2023-08-30 DIAGNOSIS — M545 Low back pain, unspecified: Secondary | ICD-10-CM

## 2023-10-29 ENCOUNTER — Encounter: Payer: Self-pay | Admitting: Internal Medicine
